# Patient Record
Sex: Male | Born: 1945 | Race: Black or African American | Hispanic: No | Marital: Married | Smoking: Never smoker
Health system: Southern US, Community
[De-identification: ages and names within clinical notes are randomized; demographics above are authoritative.]

## PROBLEM LIST (undated history)

## (undated) DIAGNOSIS — K219 Gastro-esophageal reflux disease without esophagitis: Secondary | ICD-10-CM

## (undated) DIAGNOSIS — E785 Hyperlipidemia, unspecified: Secondary | ICD-10-CM

## (undated) DIAGNOSIS — N400 Enlarged prostate without lower urinary tract symptoms: Secondary | ICD-10-CM

## (undated) DIAGNOSIS — E119 Type 2 diabetes mellitus without complications: Secondary | ICD-10-CM

## (undated) DIAGNOSIS — J301 Allergic rhinitis due to pollen: Secondary | ICD-10-CM

## (undated) DIAGNOSIS — N529 Male erectile dysfunction, unspecified: Secondary | ICD-10-CM

## (undated) HISTORY — DX: Benign prostatic hyperplasia without lower urinary tract symptoms: N40.0

## (undated) HISTORY — PX: KIDNEY STONE SURGERY: SHX686

## (undated) HISTORY — DX: Allergic rhinitis due to pollen: J30.1

## (undated) HISTORY — DX: Male erectile dysfunction, unspecified: N52.9

## (undated) HISTORY — DX: Type 2 diabetes mellitus without complications: E11.9

## (undated) HISTORY — DX: Hyperlipidemia, unspecified: E78.5

## (undated) HISTORY — DX: Gastro-esophageal reflux disease without esophagitis: K21.9

---

## 1969-07-17 HISTORY — PX: WRIST SURGERY: SHX841

## 2000-04-14 ENCOUNTER — Emergency Department (HOSPITAL_COMMUNITY): Admission: EM | Admit: 2000-04-14 | Discharge: 2000-04-14 | Payer: Self-pay | Admitting: Emergency Medicine

## 2000-04-14 ENCOUNTER — Encounter: Payer: Self-pay | Admitting: Emergency Medicine

## 2004-11-14 ENCOUNTER — Ambulatory Visit: Payer: Self-pay | Admitting: Internal Medicine

## 2005-02-16 ENCOUNTER — Ambulatory Visit: Payer: Self-pay | Admitting: Internal Medicine

## 2005-03-18 ENCOUNTER — Ambulatory Visit: Payer: Self-pay | Admitting: Internal Medicine

## 2005-04-10 ENCOUNTER — Ambulatory Visit: Payer: Self-pay | Admitting: Internal Medicine

## 2005-08-28 ENCOUNTER — Ambulatory Visit: Payer: Self-pay | Admitting: Internal Medicine

## 2007-02-03 ENCOUNTER — Ambulatory Visit: Payer: Self-pay | Admitting: Internal Medicine

## 2007-02-03 LAB — CONVERTED CEMR LAB
ALT: 29 units/L (ref 0–40)
AST: 21 units/L (ref 0–37)
Albumin: 3.7 g/dL (ref 3.5–5.2)
Alkaline Phosphatase: 65 units/L (ref 39–117)
BUN: 12 mg/dL (ref 6–23)
Basophils Absolute: 0 10*3/uL (ref 0.0–0.1)
Basophils Relative: 0.4 % (ref 0.0–1.0)
Bilirubin, Direct: 0.1 mg/dL (ref 0.0–0.3)
CO2: 30 meq/L (ref 19–32)
Calcium: 9.1 mg/dL (ref 8.4–10.5)
Chloride: 103 meq/L (ref 96–112)
Cholesterol: 229 mg/dL (ref 0–200)
Creatinine, Ser: 0.9 mg/dL (ref 0.4–1.5)
Direct LDL: 169.8 mg/dL
Eosinophils Absolute: 0.3 10*3/uL (ref 0.0–0.6)
Eosinophils Relative: 5.2 % — ABNORMAL HIGH (ref 0.0–5.0)
GFR calc Af Amer: 111 mL/min
GFR calc non Af Amer: 92 mL/min
Glucose, Bld: 112 mg/dL — ABNORMAL HIGH (ref 70–99)
HCT: 43.9 % (ref 39.0–52.0)
HDL: 41.4 mg/dL (ref >39.0)
Hemoglobin: 15 g/dL (ref 13.0–17.0)
Lymphocytes Relative: 42.5 % (ref 12.0–46.0)
MCHC: 34.1 g/dL (ref 30.0–36.0)
MCV: 86.6 fL (ref 78.0–100.0)
Monocytes Absolute: 0.7 10*3/uL (ref 0.2–0.7)
Monocytes Relative: 10.9 % (ref 3.0–11.0)
Neutro Abs: 2.5 10*3/uL (ref 1.4–7.7)
Neutrophils Relative %: 41 % — ABNORMAL LOW (ref 43.0–77.0)
PSA: 0.52 ng/mL (ref 0.10–4.00)
Platelets: 231 10*3/uL (ref 150–400)
Potassium: 3.9 meq/L (ref 3.5–5.1)
RBC: 5.07 M/uL (ref 4.22–5.81)
RDW: 13.7 % (ref 11.5–14.6)
Sodium: 139 meq/L (ref 135–145)
TSH: 0.9 microintl units/mL (ref 0.35–5.50)
Total Bilirubin: 0.6 mg/dL (ref 0.3–1.2)
Total CHOL/HDL Ratio: 5.5
Total Protein: 6.9 g/dL (ref 6.0–8.3)
Triglycerides: 116 mg/dL (ref 0–149)
VLDL: 23 mg/dL (ref 0–40)
WBC: 6 10*3/uL (ref 4.5–10.5)

## 2007-02-10 ENCOUNTER — Ambulatory Visit: Payer: Self-pay | Admitting: Internal Medicine

## 2008-03-08 ENCOUNTER — Telehealth (INDEPENDENT_AMBULATORY_CARE_PROVIDER_SITE_OTHER): Payer: Self-pay | Admitting: *Deleted

## 2009-02-12 ENCOUNTER — Emergency Department (HOSPITAL_COMMUNITY): Admission: EM | Admit: 2009-02-12 | Discharge: 2009-02-12 | Payer: Self-pay | Admitting: Family Medicine

## 2009-02-14 ENCOUNTER — Ambulatory Visit: Payer: Self-pay | Admitting: Internal Medicine

## 2009-02-14 DIAGNOSIS — H8110 Benign paroxysmal vertigo, unspecified ear: Secondary | ICD-10-CM

## 2009-08-28 ENCOUNTER — Ambulatory Visit: Payer: Self-pay | Admitting: Family Medicine

## 2009-08-28 ENCOUNTER — Encounter: Payer: Self-pay | Admitting: Family Medicine

## 2009-10-18 ENCOUNTER — Ambulatory Visit: Payer: Self-pay | Admitting: Internal Medicine

## 2009-10-18 DIAGNOSIS — R7301 Impaired fasting glucose: Secondary | ICD-10-CM

## 2009-10-22 LAB — CONVERTED CEMR LAB
Cholesterol: 243 mg/dL — ABNORMAL HIGH (ref 0–200)
HDL: 44 mg/dL (ref >39)
Hgb A1c MFr Bld: 7.2 % — ABNORMAL HIGH (ref 4.6–6.1)
LDL Cholesterol: 150 mg/dL — ABNORMAL HIGH (ref 0–99)
PSA: 0.91 ng/mL (ref 0.10–4.00)
Total CHOL/HDL Ratio: 5.5
Triglycerides: 245 mg/dL — ABNORMAL HIGH (ref <150)
VLDL: 49 mg/dL — ABNORMAL HIGH (ref 0–40)

## 2009-11-01 ENCOUNTER — Ambulatory Visit: Payer: Self-pay | Admitting: Internal Medicine

## 2009-11-04 ENCOUNTER — Encounter: Admission: RE | Admit: 2009-11-04 | Discharge: 2009-11-13 | Payer: Self-pay | Admitting: Internal Medicine

## 2009-11-04 ENCOUNTER — Encounter: Payer: Self-pay | Admitting: Internal Medicine

## 2009-11-04 LAB — CONVERTED CEMR LAB: Fecal Occult Bld: NEGATIVE

## 2010-06-17 ENCOUNTER — Ambulatory Visit: Payer: Self-pay | Admitting: Family Medicine

## 2010-06-17 DIAGNOSIS — J069 Acute upper respiratory infection, unspecified: Secondary | ICD-10-CM | POA: Insufficient documentation

## 2010-07-24 ENCOUNTER — Telehealth: Payer: Self-pay | Admitting: Family Medicine

## 2010-07-24 DIAGNOSIS — H9209 Otalgia, unspecified ear: Secondary | ICD-10-CM | POA: Insufficient documentation

## 2010-07-25 ENCOUNTER — Encounter: Payer: Self-pay | Admitting: Internal Medicine

## 2010-10-27 ENCOUNTER — Telehealth: Payer: Self-pay | Admitting: Internal Medicine

## 2010-12-16 NOTE — Consult Note (Signed)
Summary: [facility] Ear Nose & Throat  [facility] Ear Nose & Throat   Imported By: Sherian Rein 08/02/2010 09:44:04  _____________________________________________________________________  External Attachment:    Type:   Image     Comment:   External Document  Appended Document: [facility] Ear Nose & Throat left sided TMJ Trying ibuprofen

## 2010-12-16 NOTE — Progress Notes (Signed)
Summary: requesting refill for augmentin  Phone Note Call from Patient Call back at Home Phone [phone] Call back at [phone]   Caller: Patient Call For: Dr. Dayton Martes Summary of Call: Patient was in on 06-17-10 w/ ear pain. He is now on his last day of augmentin and the pain in his ear is coming back just like before. He says that there is popping and is becoming swollen again like before. He is asking if he could get a refil on the augmentin and if you feel like he may need a referral to ENT to see why this want go away. Please advise. Patient is at work and he says to leave message w/ his fiance at his home number. Uses [employer] on [address].  Initial call taken by: Melody Comas,  July 24, 2010 1:33 PM  Follow-up for Phone Call        Agree with ENT referral.  We do not give abx without being seen.  I will place ENT referral today and hopefully we can get in him in right away. Ruthe Mannan MD  July 24, 2010 1:40 PM   Spoke w/ patient and he will wait for call w/ appt for ENT. Home number disconnected. He can be reached at [phone]. Follow-up by: Melody Comas,  July 24, 2010 1:51 PM  New Problems: EAR PAIN (ICD-388.70)   New Problems: EAR PAIN (ICD-388.70)

## 2010-12-16 NOTE — Assessment & Plan Note (Signed)
Summary: ear ache, inner ear/alc   Vital Signs:  Patient profile:   65 year old Douglas [name] Height:      67.5 inches Weight:      190.13 pounds BMI:     29.45 Temp:     97.7 degrees F oral Pulse rate:   76 / minute Pulse rhythm:   regular BP sitting:   122 / 80  (left arm) Cuff size:   regular  Vitals Entered By: Linde Gillis CMA Duncan Dull) (June 17, 2010 8:08 AM) CC: left ear ache   History of Present Illness: 65 yo here for 1 week of worsening left ear pain. Has runny nose, facial pressure, feels feverish, headache. No cough, shortness of breath or wheezing. Taking OTC decongestants but left ear pain and popping is worsening.  Current Medications (verified): 1)  Viagra 100 Mg Tabs (Sildenafil Citrate) .... 1/2-1 Tab About 30-60 Minutes Before Sex 2)  Augmentin 875-125 Mg Tabs (Amoxicillin-Pot Clavulanate) .Marland Kitchen.. 1 By Mouth 2 Times Daily 3)  Vicodin 5-500 Mg Tabs (Hydrocodone-Acetaminophen) .Marland Kitchen.. 1 Tab Every 6 Hours As Needed For Pain  Allergies (verified): No Known Drug Allergies  Review of Systems      See HPI General:  Denies fever. ENT:  Complains of earache, nasal congestion, sinus pressure, and sore throat; denies ear discharge. Resp:  Denies cough, shortness of breath, sputum productive, and wheezing.  Physical Exam  General:  alert and normal appearance.   Ears:  Left TM and canal erythema, tragus non tender Right TM normal Nose:  nasal dischargemucosal pallor.   Mouth:  pharyngeal erythema Lungs:  normal respiratory effort and normal breath sounds.   Heart:  normal rate, regular rhythm, no murmur, and no gallop.   Extremities:  no edema Psych:  normally interactive, good eye contact, not anxious appearing, and not depressed appearing.     Impression & Recommendations:  Problem # 1:  URI (ICD-465.9) Assessment New Given progression and duration of symptoms, will treat for bacterial sinusitis/otitis with Augmentin. See pt instructions for details.  Complete  Medication List: 1)  Viagra 100 Mg Tabs (Sildenafil citrate) .... 1/2-1 tab about 30-60 minutes before sex 2)  Augmentin 875-125 Mg Tabs (Amoxicillin-pot clavulanate) .Marland Kitchen.. 1 by mouth 2 times daily 3)  Vicodin 5-500 Mg Tabs (Hydrocodone-acetaminophen) .Marland Kitchen.. 1 tab every 6 hours as needed for pain  Patient Instructions: 1)  Take antibiotic as directed.  Drink lots of fluids.  Treat sympotmatically with Mucinex, nasal saline irrigation, and Tylenol/Ibuprofen. Also try claritin D or zyrtec D over the counter- two times a day as needed ( have to sign for them at pharmacy). You can use warm compresses.  Cough suppressant at night. Call if not improving as expected in 5-7 days.  Prescriptions: VICODIN 5-500 MG TABS (HYDROCODONE-ACETAMINOPHEN) 1 tab every 6 hours as needed for pain  #30 x 0   Entered and Authorized by:   Ruthe Mannan MD   Signed by:   Ruthe Mannan MD on 06/17/2010   Method used:   Print then Give to Patient   RxID:   [id number] AUGMENTIN 875-125 MG TABS (AMOXICILLIN-POT CLAVULANATE) 1 by mouth 2 times daily  #Douglas x 3   Entered and Authorized by:   Ruthe Mannan MD   Signed by:   Ruthe Mannan MD on 06/17/2010   Method used:   Print then Give to Patient   RxID:   [id number]   Current Allergies (reviewed today): No known allergies

## 2010-12-18 NOTE — Progress Notes (Signed)
Summary: VIAGRA  Phone Note Refill Request [facility]  [facility] [phone] on October 27, 2010 11:58 AM  Refills Requested: Medication #1:  VIAGRA 100 MG TABS 1/2-1 tab about 30-60 minutes before sex   Last Refilled: 04/17/2008 E-Scribe Request, ok to fill? last refilled 04/17/08, pt last seen 10/2009   Method Requested: Electronic Initial call taken by: Mervin Hack CMA Duncan Dull),  October 27, 2010 11:58 AM  Follow-up for Phone Call        okay #6 x 2  have him set up physical in the next few months Follow-up by: Cindee Salt MD,  October 27, 2010 1:20 PM  Additional Follow-up for Phone Call Additional follow up Details #1::        Rx faxed to pharmacy, home number is disconnected and pt doesn't work at number provided for his job, will leave message at the pharmacy. They will let pt know he will need a office visit soon. DeShannon Smith CMA Duncan Dull)  October 27, 2010 2:30 PM   okay Additional Follow-up by: Cindee Salt MD,  October 27, 2010 2:35 PM    Prescriptions: VIAGRA 100 MG TABS (SILDENAFIL CITRATE) 1/2-1 tab about 30-60 minutes before sex  #6 x 2   Entered by:   Mervin Hack CMA (AAMA)   Authorized by:   Cindee Salt MD   Signed by:   Mervin Hack CMA (AAMA) on 10/27/2010   Method used:   Electronically to        [id number] (retail)       [address]       [place], [place]  [id number]       Ph: [phone]       Fax: [phone]   RxID:   [id number]

## 2010-12-21 ENCOUNTER — Emergency Department (HOSPITAL_BASED_OUTPATIENT_CLINIC_OR_DEPARTMENT_OTHER): Admission: EM | Admit: 2010-12-21 | Payer: Self-pay | Source: Home / Self Care

## 2011-02-26 LAB — POCT I-STAT, CHEM 8
BUN: 11 mg/dL (ref 6–23)
Calcium, Ion: 1.23 mmol/L (ref 1.12–1.32)
Chloride: 102 mEq/L (ref 96–112)
Creatinine, Ser: 1 mg/dL (ref 0.4–1.5)
Glucose, Bld: 98 mg/dL (ref 70–99)
HCT: 51 % (ref 39.0–52.0)
Hemoglobin: 17.3 g/dL — ABNORMAL HIGH (ref 13.0–17.0)
Potassium: 4.2 mEq/L (ref 3.5–5.1)
Sodium: 139 mEq/L (ref 135–145)
TCO2: 28 mmol/L (ref 0–100)

## 2012-10-12 ENCOUNTER — Telehealth: Payer: Self-pay

## 2012-10-12 NOTE — Telephone Encounter (Signed)
4-5 days ago pt noticed sensitive area on end of penis, red, pt not sure if blister or not; no discharge and no pain upon urination. Pt is sexually active but will abstain until checked. Appt scheduled 10/14/12 at 8 am. If pts condition changes or worsens will go to urgent care.

## 2012-10-14 ENCOUNTER — Encounter: Payer: Self-pay | Admitting: Family Medicine

## 2012-10-14 ENCOUNTER — Ambulatory Visit (INDEPENDENT_AMBULATORY_CARE_PROVIDER_SITE_OTHER): Payer: BC Managed Care – PPO | Admitting: Family Medicine

## 2012-10-14 VITALS — BP 142/94 | HR 76 | Temp 97.7°F | Wt 188.5 lb

## 2012-10-14 DIAGNOSIS — N489 Disorder of penis, unspecified: Secondary | ICD-10-CM

## 2012-10-14 DIAGNOSIS — Z209 Contact with and (suspected) exposure to unspecified communicable disease: Secondary | ICD-10-CM

## 2012-10-14 DIAGNOSIS — N4889 Other specified disorders of penis: Secondary | ICD-10-CM

## 2012-10-14 NOTE — Progress Notes (Signed)
   Nature conservation officer at [facility] [address] [place] [address] Phone: [phone] Fax: [phone]  Date:  10/14/2012   Name:  Douglas [name]   DOB:  Mar 04, 1946   MRN:  [id number] Gender: male Age: 66 y.o.  PCP:  Tillman Abide, MD  Evaluating MD: Hannah Beat, MD   Chief Complaint: Personal Problem   History of Present Illness:  Douglas [name] is a 66 y.o. pleasant patient who presents with the following:  Slept with someone on 111/16/2013 without a condrom. No history of STD. 18th or 19th, but does have a little red bister and the head is a little reddish, does not have any pain. Every now and then, a little burning at the tip. Occ. Burning, no discharge. 2 areas of ulcer, no real pain.  Patient Active Problem List  Diagnosis  . DIABETES MELLITUS, TYPE II  . HYPERLIPIDEMIA  . ALLERGIC RHINITIS  . GERD  . VERTIGO    No past medical history on file.  No past surgical history on file.  History  Substance Use Topics  . Smoking status: Never Smoker   . Smokeless tobacco: Not on file  . Alcohol Use: No    No family history on file.  No Known Allergies  Medication list has been reviewed and updated.  No outpatient prescriptions prior to visit.    Last reviewed on 10/14/2012  8:23 AM by Shon Millet, CMA  Review of Systems:  ROS: GEN: Acute illness details above GI: Tolerating PO intake GU: maintaining adequate hydration and urination Pulm: No SOB Interactive and getting along well at home.  Otherwise, ROS is as per the HPI.   Physical Examination: Filed Vitals:   10/14/12 0818  BP: 142/94  Pulse: 76  Temp: 97.7 F (36.5 C)  Weight: 188 lb 8 oz (85.503 kg)    There is no height on file to calculate BMI. Ideal Body Weight:     GEN: WDWN, NAD, Non-toxic, Alert & Oriented x 3 HEENT: Atraumatic, Normocephalic.  Ears and Nose: No external deformity. EXTR: No clubbing/cyanosis/edema NEURO: Normal gait.  PSYCH: Normally  interactive. Conversant. Not depressed or anxious appearing.  Calm demeanor.  GU: uncirc male, intact, des testes, small red ulcer on glans and 2 prominent ulcers near meatus.  Assessment and Plan:  1. Penile lesion  Herpes simplex virus culture, GC/chlamydia probe amp, genital, HIV antibody, RPR  2. Exposure to communicable disease     >25 minutes spent in face to face time with patient, >50% spent in counselling or coordination of care: talked about different stds, herpes, lifelong infection, condom use. Answered questions. Clinically most looks like herpes, but not painful Check std labs  If all neg, treat for bact urethritis  Orders Today:  Orders Placed This Encounter  Procedures  . Herpes simplex virus culture  . GC/chlamydia probe amp, genital  . HIV antibody  . RPR    Updated Medication List: (Includes new medications, updates to list, dose adjustments) No orders of the defined types were placed in this encounter.    Medications Discontinued: There are no discontinued medications.   Hannah Beat, MD

## 2012-10-14 NOTE — Addendum Note (Signed)
Addended by: Alvina Chou on: 10/14/2012 09:42 AM   Modules accepted: Orders

## 2012-10-15 LAB — GC/CHLAMYDIA PROBE AMP, GENITAL

## 2012-10-15 LAB — RPR

## 2012-10-16 LAB — GC/CHLAMYDIA PROBE AMP
CT Probe RNA: NEGATIVE
GC Probe RNA: NEGATIVE

## 2012-10-18 LAB — HERPES SIMPLEX VIRUS CULTURE: Organism ID, Bacteria: NOT DETECTED

## 2012-10-25 ENCOUNTER — Encounter: Payer: Self-pay | Admitting: *Deleted

## 2012-11-11 ENCOUNTER — Ambulatory Visit: Payer: Self-pay | Admitting: Family Medicine

## 2013-05-16 ENCOUNTER — Encounter: Payer: Self-pay | Admitting: Family Medicine

## 2013-05-16 ENCOUNTER — Ambulatory Visit (INDEPENDENT_AMBULATORY_CARE_PROVIDER_SITE_OTHER): Payer: BC Managed Care – PPO | Admitting: Family Medicine

## 2013-05-16 VITALS — BP 140/92 | HR 89 | Temp 97.9°F | Ht 67.5 in | Wt 184.0 lb

## 2013-05-16 DIAGNOSIS — N489 Disorder of penis, unspecified: Secondary | ICD-10-CM | POA: Insufficient documentation

## 2013-05-16 DIAGNOSIS — N4889 Other specified disorders of penis: Secondary | ICD-10-CM

## 2013-05-16 MED ORDER — AZITHROMYCIN 500 MG PO TABS: 1000.0000 mg | ORAL_TABLET | Freq: Once | ORAL | Status: DC

## 2013-05-16 NOTE — Patient Instructions (Addendum)
We will call you with herpes culture results.  We will go ahead and treat you for bacterial urethritis as Dr. Patsy Lager intended in 09/2012.  Could possibly be due to irritation from soap.Marland Kitchen Use hypoallergenic detergent and body soap only.  If not improving call for referral to urologist.

## 2013-05-16 NOTE — Progress Notes (Signed)
  Subjective:    Patient ID: Douglas [name], male    DOB: February 20, 1946, 67 y.o.   MRN: [id number]  HPI 67 year old male presetns for evaluation of penile rash. He was seen in November 2013 for similar rash by Dr. Patsy Lager.  he noted onset of 3 ulcers, one at tip of penis following sexual activity at that time. He was evaluated for GC/chlamydia, herpes culture, RPR, trichomonas, HIV... Returned negative. He was treated with azithromycin x 1 g for possible bacterial urethritis at that time.  He never took any treatment.  He nted lesions go away almost entirely but still ahd some sensitivity to touvch  1 week ago he noted the lesions has gradually worsening somewhat, may be spreading toward base of glans. Are is tender. No pain with urination. No urethral discharge. He is applying vaseline and occ neosporin.  No fever, feels well otherwise.  Has never had any similar issue, no past STDs.     Review of Systems  Constitutional: Negative for fever and fatigue.  HENT: Negative for ear pain.   Eyes: Negative for pain.  Respiratory: Negative for shortness of breath and wheezing.   Cardiovascular: Negative for chest pain.  Gastrointestinal: Negative for abdominal pain.       Objective:   Physical Exam  Constitutional: He appears well-developed and well-nourished.  Eyes: Conjunctivae are normal. Pupils are equal, round, and reactive to light.  Cardiovascular: Normal rate and regular rhythm.   No murmur heard. Pulmonary/Chest: Effort normal and breath sounds normal.  Abdominal: Soft. Bowel sounds are normal. Hernia confirmed negative in the right inguinal area and confirmed negative in the left inguinal area.  Genitourinary: Testes normal. Right testis shows no mass and no tenderness. Left testis shows no mass and no tenderness. Uncircumcised. Penile erythema present. No hypospadias.  Erythema  At 10 oclkock on glans of penis, erythematous nodules at 6 oclock on glans, new flaky skin on  left side of glans in spot pattern.  only tender areas are at 11 and 6 oclock. No trur ulceration.          Assessment & Plan:

## 2013-05-16 NOTE — Assessment & Plan Note (Signed)
STD eval neg from when issue began. Will reswab for herpes culture given tender lesions Will treat for bacterial urethritis.  If not improcving he will cahnge to hypoallergenic soaps for possibility of irritant dermatitis.. Can try OTC 2.5 % hydrocortisone cream BID x 2 weeks as well. If not improving consider referral to specialist.

## 2013-05-16 NOTE — Addendum Note (Signed)
Addended by: Consuello Masse on: 05/16/2013 12:38 PM   Modules accepted: Orders

## 2013-05-18 ENCOUNTER — Telehealth: Payer: Self-pay | Admitting: Family Medicine

## 2013-05-18 LAB — HERPES SIMPLEX VIRUS CULTURE: Organism ID, Bacteria: DETECTED

## 2013-05-18 MED ORDER — VALACYCLOVIR HCL 1 G PO TABS: 1000.0000 mg | ORAL_TABLET | Freq: Two times a day (BID) | ORAL | Status: DC

## 2013-05-18 NOTE — Telephone Encounter (Signed)
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  We need to treat him with valacyclovir.   This is contagious with sexual contact.. Let partners know about exposure. NO sexual contact while lesions remain.  he can have flares off and on lifelong.. No cure.  If frequent flares he can take daily med to decrease flare frequency.

## 2013-05-18 NOTE — Telephone Encounter (Signed)
Patient advised and will get medication

## 2013-07-27 ENCOUNTER — Ambulatory Visit (INDEPENDENT_AMBULATORY_CARE_PROVIDER_SITE_OTHER): Payer: BC Managed Care – PPO | Admitting: Internal Medicine

## 2013-07-27 ENCOUNTER — Encounter: Payer: Self-pay | Admitting: Internal Medicine

## 2013-07-27 VITALS — BP 150/80 | HR 86 | Temp 98.2°F | Wt 186.0 lb

## 2013-07-27 DIAGNOSIS — N4889 Other specified disorders of penis: Secondary | ICD-10-CM

## 2013-07-27 DIAGNOSIS — N489 Disorder of penis, unspecified: Secondary | ICD-10-CM

## 2013-07-27 DIAGNOSIS — R7301 Impaired fasting glucose: Secondary | ICD-10-CM

## 2013-07-27 DIAGNOSIS — Z125 Encounter for screening for malignant neoplasm of prostate: Secondary | ICD-10-CM

## 2013-07-27 DIAGNOSIS — N529 Male erectile dysfunction, unspecified: Secondary | ICD-10-CM | POA: Insufficient documentation

## 2013-07-27 DIAGNOSIS — J301 Allergic rhinitis due to pollen: Secondary | ICD-10-CM | POA: Insufficient documentation

## 2013-07-27 DIAGNOSIS — K219 Gastro-esophageal reflux disease without esophagitis: Secondary | ICD-10-CM | POA: Insufficient documentation

## 2013-07-27 DIAGNOSIS — E785 Hyperlipidemia, unspecified: Secondary | ICD-10-CM

## 2013-07-27 MED ORDER — TRIAMCINOLONE ACETONIDE 0.1 % EX CREA: TOPICAL_CREAM | Freq: Two times a day (BID) | CUTANEOUS | Status: DC | PRN

## 2013-07-27 NOTE — Assessment & Plan Note (Signed)
Doesn't look herpetic now--- or fungal ?lichen planus  Will try steroid If not effective, to derm

## 2013-07-27 NOTE — Patient Instructions (Signed)
Please try a small amount of the prescription cream on the sore spots on your penis----if they don't improve, call for referral to a dermatologist.

## 2013-07-27 NOTE — Progress Notes (Signed)
  Subjective:    Patient ID: Douglas [name], male    DOB: [birth year], 67 y.o.   MRN: [id number]  HPI Hasn't been seen in some time  Has lesion on penis again Took the 10 days of valtrex after tested positive for herpes Cleared partially-- then worsened again  Same girlfriend for 8-9 years No sex for 2 years of so Has been abstinent for some time  No current outpatient prescriptions on file prior to visit.   No current facility-administered medications on file prior to visit.    No Known Allergies  Past Medical History  Diagnosis Date  . GERD (gastroesophageal reflux disease)   . Allergic rhinitis due to pollen   . Impaired fasting glucose   . Hyperlipidemia   . ED (erectile dysfunction)     Past Surgical History  Procedure Laterality Date  . Wrist surgery  1970's  . Kidney stone surgery  ~1990    Family History  Problem Relation Age of Onset  . Cancer Mother     History   Social History  . Marital Status: Divorced    Spouse Name: N/A    Number of Children: 3  . Years of Education: N/A   Occupational History  . [employer]   Social History Main Topics  . Smoking status: Never Smoker   . Smokeless tobacco: Never Used  . Alcohol Use: No  . Drug Use: No  . Sexual Activity: Not on file   Other Topics Concern  . Not on file   Social History Narrative  . No narrative on file   Review of Systems Occasionally gets slight vertigo---if he bends and stands up quick Weight fairly stable    Objective:   Physical Exam  Constitutional: He appears well-developed and well-nourished. No distress.  Genitourinary:  Shallow ulcerations on glans No discharge Urethra normal          Assessment & Plan:

## 2013-07-28 LAB — BASIC METABOLIC PANEL
BUN: 13 mg/dL (ref 6–23)
Chloride: 102 mEq/L (ref 96–112)
Creatinine, Ser: 1.1 mg/dL (ref 0.4–1.5)
GFR: 83.54 mL/min (ref >60.00)

## 2013-07-28 LAB — CBC WITH DIFFERENTIAL/PLATELET
Basophils Relative: 0.6 % (ref 0.0–3.0)
Eosinophils Relative: 4.8 % (ref 0.0–5.0)
Lymphocytes Relative: 37.4 % (ref 12.0–46.0)
Monocytes Relative: 9.6 % (ref 3.0–12.0)
Neutrophils Relative %: 47.6 % (ref 43.0–77.0)
Platelets: 196 10*3/uL (ref 150.0–400.0)
RBC: 5.08 Mil/uL (ref 4.22–5.81)
WBC: 6.1 10*3/uL (ref 4.5–10.5)

## 2013-07-28 LAB — PSA: PSA: 1.29 ng/mL (ref 0.10–4.00)

## 2013-07-28 LAB — HEPATIC FUNCTION PANEL
ALT: 42 U/L (ref 0–53)
AST: 23 U/L (ref 0–37)
Alkaline Phosphatase: 78 U/L (ref 39–117)
Bilirubin, Direct: 0.1 mg/dL (ref 0.0–0.3)
Total Bilirubin: 0.6 mg/dL (ref 0.3–1.2)

## 2013-07-28 LAB — TSH: TSH: 0.57 u[IU]/mL (ref 0.35–5.50)

## 2013-07-28 LAB — LIPID PANEL
Cholesterol: 260 mg/dL — ABNORMAL HIGH (ref 0–200)
Total CHOL/HDL Ratio: 7
Triglycerides: 380 mg/dL — ABNORMAL HIGH (ref 0.0–149.0)

## 2013-07-28 LAB — LDL CHOLESTEROL, DIRECT: Direct LDL: 188.3 mg/dL

## 2013-07-29 ENCOUNTER — Encounter: Payer: Self-pay | Admitting: Internal Medicine

## 2013-08-08 ENCOUNTER — Other Ambulatory Visit: Payer: Self-pay | Admitting: Internal Medicine

## 2013-08-08 ENCOUNTER — Other Ambulatory Visit: Payer: Self-pay | Admitting: *Deleted

## 2013-08-08 ENCOUNTER — Encounter: Payer: Self-pay | Admitting: *Deleted

## 2013-08-08 DIAGNOSIS — E119 Type 2 diabetes mellitus without complications: Secondary | ICD-10-CM

## 2013-08-08 MED ORDER — METFORMIN HCL 1000 MG PO TABS: 1000.0000 mg | ORAL_TABLET | Freq: Two times a day (BID) | ORAL | Status: DC

## 2013-08-10 ENCOUNTER — Telehealth: Payer: Self-pay | Admitting: Internal Medicine

## 2013-08-10 MED ORDER — FLUCONAZOLE 100 MG PO TABS: 100.0000 mg | ORAL_TABLET | Freq: Every day | ORAL | Status: DC

## 2013-08-10 MED ORDER — KETOCONAZOLE 2 % EX CREA: TOPICAL_CREAM | Freq: Every day | CUTANEOUS | Status: DC

## 2013-08-10 NOTE — Telephone Encounter (Signed)
While speaking to the pt regarding his NDM referral, he informed me that the Kenalog cream that was prescribed on 07/27/13 was not working well and he wanted to let Dr. Alphonsus Sias know.  Best number to call patient:  [phone] (may leave message and he will return call) Pharmacy of choice:  [facility]

## 2013-08-10 NOTE — Telephone Encounter (Signed)
Given his diabetes diagnosis, this lesions may be fungal Will change therapy Discussed this with him

## 2013-09-07 ENCOUNTER — Encounter: Payer: BC Managed Care – PPO | Attending: Internal Medicine

## 2013-09-07 DIAGNOSIS — Z713 Dietary counseling and surveillance: Secondary | ICD-10-CM | POA: Insufficient documentation

## 2013-09-07 DIAGNOSIS — E119 Type 2 diabetes mellitus without complications: Secondary | ICD-10-CM | POA: Insufficient documentation

## 2013-09-14 DIAGNOSIS — E119 Type 2 diabetes mellitus without complications: Secondary | ICD-10-CM

## 2013-09-14 NOTE — Progress Notes (Signed)
Patient was seen on 09/14/13 for the second of a series of three diabetes self-management courses at [facility]. The following learning objectives were met by the patient during this class:   Describe the role of different macronutrients on glucose  Explain how carbohydrates affect blood glucose  State what foods contain the most carbohydrates  Demonstrate carbohydrate counting  Demonstrate how to read Nutrition Facts food label  Describe effects of various fats on heart health  Describe the importance of good nutrition for health and healthy eating strategies  Describe techniques for managing your shopping, cooking and meal planning  List strategies to follow meal plan when dining out  Describe the effects of alcohol on glucose and how to use it safely  Follow-Up Plan:  Attend Core 3  Work towards following your personal food plan.   

## 2013-09-21 ENCOUNTER — Encounter: Payer: BC Managed Care – PPO | Attending: Internal Medicine

## 2013-09-21 DIAGNOSIS — E119 Type 2 diabetes mellitus without complications: Secondary | ICD-10-CM

## 2013-09-21 DIAGNOSIS — Z713 Dietary counseling and surveillance: Secondary | ICD-10-CM | POA: Insufficient documentation

## 2013-09-28 NOTE — Progress Notes (Signed)
Patient was seen on 09/21/13 for the third of a series of three diabetes self-management courses at the [facility]. The following learning objectives were met by the patient during this class:    State the amount of activity recommended for healthy living   Describe activities suitable for individual needs   Identify ways to regularly incorporate activity into daily life   Identify barriers to activity and ways to over come these barriers  Identify diabetes medications being personally used and their primary action for lowering glucose and possible side effects   Describe role of stress on blood glucose and develop strategies to address psychosocial issues   Identify diabetes complications and ways to prevent them  Explain how to manage diabetes during illness   Evaluate success in meeting personal goal   Establish 2-3 goals that they will plan to diligently work on until they return for the free 77-month follow-up visit  Your patient has established the following 4 month goals in their individualized success plan:  Count Carbohydrates at most meal and snacks  Be active 15 minutes or more 5 times a week  Test glucose 1X daily 4Xweek Look at patterns in my record book at least 4 days a month  Your patient has identified these potential barriers to change:  None noted  Your patient has identified their diabetes self-care support plan as  [facility]

## 2014-01-15 LAB — HM DIABETES EYE EXAM

## 2014-03-22 ENCOUNTER — Telehealth: Payer: Self-pay

## 2014-03-22 MED ORDER — SILDENAFIL CITRATE 100 MG PO TABS: ORAL_TABLET | ORAL | Status: DC

## 2014-03-22 NOTE — Telephone Encounter (Signed)
Okay sildenafil 100mg  #5 x 2 Use daily prn about 30-60 minutes before sex  Have him schedule PE in the next few months

## 2014-03-22 NOTE — Telephone Encounter (Signed)
[facility] [address] Pt request cb.

## 2014-03-22 NOTE — Telephone Encounter (Signed)
rx sent to pharmacy by e-script Left message on VM that rx was sent to pharmacy

## 2014-04-19 ENCOUNTER — Encounter: Payer: Self-pay | Admitting: Family Medicine

## 2014-04-19 ENCOUNTER — Ambulatory Visit (INDEPENDENT_AMBULATORY_CARE_PROVIDER_SITE_OTHER): Payer: BC Managed Care – PPO | Admitting: Family Medicine

## 2014-04-19 VITALS — BP 120/76 | HR 74 | Temp 97.8°F | Wt 181.5 lb

## 2014-04-19 DIAGNOSIS — R42 Dizziness and giddiness: Secondary | ICD-10-CM

## 2014-04-19 MED ORDER — MECLIZINE HCL 25 MG PO TABS: 25.0000 mg | ORAL_TABLET | Freq: Three times a day (TID) | ORAL | Status: DC | PRN

## 2014-04-19 NOTE — Progress Notes (Signed)
BP 120/76  Pulse 74  Temp(Src) 97.8 F (36.6 C) (Oral)  Wt 181 lb 8 oz (82.328 kg)  SpO2 96%   CC: ?ear infection  Subjective:    Patient ID: Douglas [name], male    DOB: 1946-06-11, 68 y.o.   MRN: [id number]  HPI: Douglas BeatJohn W Peeples is a 68 y.o. male presenting on 04/19/2014 for ? ear infection, balance off, ST   Pleasant 68 yo pt of Dr. Karle StarchLetvak's with h/o DM presents with 1 wk h/o muffled R hearing with echo, suddenly increased dizziness last night while working underneath his truck described as vertigo that lasted seconds. Felt nauseated as well and vomited x1, felt better after this. Dizzy with head position changes. Mild ST. + sinus congestion. PNdrainage. White mucous out of nose, slightly bloody.   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  No fevers/chills, ear or tooth pain, abd pain, cough Takes sinus pill daily piror to work.  No sick contacts at home. No smokers at home. No h/o asthma. H/o allergic rhinitis  Relevant past medical, surgical, family and social history reviewed and updated as indicated.  Allergies and medications reviewed and updated. Current Outpatient Prescriptions on File Prior to Visit  Medication Sig  . ketoconazole (NIZORAL) 2 % cream Apply topically daily.  . metFORMIN (GLUCOPHAGE) 1000 MG tablet Take 1 tablet (1,000 mg total) by mouth 2 (two) times daily with a meal.  . sildenafil (VIAGRA) 100 MG tablet Take 1 tablet by mouth as needed 30-60 minutes before sex  . triamcinolone cream (KENALOG) 0.1 % Apply topically 2 (two) times daily as needed.   No current facility-administered medications on file prior to visit.    Review of Systems Per HPI unless specifically indicated above    Objective:    BP 120/76  Pulse 74  Temp(Src) 97.8 F (36.6 C) (Oral)  Wt 181 lb 8 oz (82.328 kg)  SpO2 96%  Physical Exam  Nursing note and vitals reviewed. Constitutional: He is oriented to person, place, and time. He appears well-developed and  well-nourished. No distress.  HENT:  Head: Normocephalic and atraumatic.  Right Ear: Hearing, external ear and ear canal normal.  Left Ear: Hearing, external ear and ear canal normal.  Nose: Mucosal edema present. No rhinorrhea. Right sinus exhibits no maxillary sinus tenderness and no frontal sinus tenderness. Left sinus exhibits no maxillary sinus tenderness and no frontal sinus tenderness.  Mouth/Throat: Uvula is midline, oropharynx is clear and moist and mucous membranes are normal. No oropharyngeal exudate, posterior oropharyngeal edema, posterior oropharyngeal erythema or tonsillar abscesses.  Congested behind TMs R>L Congested nasal mucosa with white nasal mucous  Eyes: Conjunctivae and EOM are normal. Pupils are equal, round, and reactive to light. No scleral icterus.  Neck: Normal range of motion. Neck supple.  Cardiovascular: Normal rate, regular rhythm, normal heart sounds and intact distal pulses.   No murmur heard. Pulmonary/Chest: Effort normal and breath sounds normal. No respiratory distress. He has no wheezes. He has no rales.  Lymphadenopathy:    He has no cervical adenopathy.  Neurological: He is alert and oriented to person, place, and time.  + dix hallpike on R epley maneuver performed in office with good resolution of vertigo feelings  Skin: Skin is warm and dry. No rash noted.       Assessment & Plan:   Problem List Items Addressed This Visit   BPPV (benign paroxysmal positional vertigo) - Primary     R sided With possible mild  serous otitis. Pt declines flonase. Treated in office with epley maneuver. Supportive care as per instructions.  Meclizine prn.        Follow up plan: Return if symptoms worsen or fail to improve.

## 2014-04-19 NOTE — Progress Notes (Signed)
Pre visit review using our clinic review tool, if applicable. No additional management support is needed unless otherwise documented below in the visit note. 

## 2014-04-19 NOTE — Patient Instructions (Signed)
I think you had positional vertigo - treated with maneuvers in office.  May continue same maneuvers at home (provided today) May use meclizine as needed for vertigo/nausea as well. I don't think there's infection - but watch for fever >101 or worsening cough or congestion.  Benign Positional Vertigo Vertigo means you feel like you or your surroundings are moving when they are not. Benign positional vertigo is the most common form of vertigo. Benign means that the cause of your condition is not serious. Benign positional vertigo is more common in older adults. CAUSES  Benign positional vertigo is the result of an upset in the labyrinth system. This is an area in the middle ear that helps control your balance. This may be caused by a viral infection, head injury, or repetitive motion. However, often no specific cause is found. SYMPTOMS  Symptoms of benign positional vertigo occur when you move your head or eyes in different directions. Some of the symptoms may include:  Loss of balance and falls.  Vomiting.  Blurred vision.  Dizziness.  Nausea.  Involuntary eye movements (nystagmus). DIAGNOSIS  Benign positional vertigo is usually diagnosed by physical exam. If the specific cause of your benign positional vertigo is unknown, your caregiver may perform imaging tests, such as magnetic resonance imaging (MRI) or computed tomography (CT). TREATMENT  Your caregiver may recommend movements or procedures to correct the benign positional vertigo. Medicines such as meclizine, benzodiazepines, and medicines for nausea may be used to treat your symptoms. In rare cases, if your symptoms are caused by certain conditions that affect the inner ear, you may need surgery. HOME CARE INSTRUCTIONS   Follow your caregiver's instructions.  Move slowly. Do not make sudden body or head movements.  Avoid driving.  Avoid operating heavy machinery.  Avoid performing any tasks that would be dangerous to you or  others during a vertigo episode.  Drink enough fluids to keep your urine clear or pale yellow. SEEK IMMEDIATE MEDICAL CARE IF:   You develop problems with walking, weakness, numbness, or using your arms, hands, or legs.  You have difficulty speaking.  You develop severe headaches.  Your nausea or vomiting continues or gets worse.  You develop visual changes.  Your family or friends notice any behavioral changes.  Your condition gets worse.  You have a fever.  You develop a stiff neck or sensitivity to light. MAKE SURE YOU:   Understand these instructions.  Will watch your condition.  Will get help right away if you are not doing well or get worse. Document Released: 08/10/2006 Document Revised: 01/25/2012 Document Reviewed: 07/23/2011 [facility] Patient Information 2014 [place], [place].

## 2014-04-19 NOTE — Assessment & Plan Note (Signed)
R sided With possible mild serous otitis. Pt declines flonase. Treated in office with epley maneuver. Supportive care as per instructions.  Meclizine prn.

## 2014-05-23 ENCOUNTER — Ambulatory Visit (INDEPENDENT_AMBULATORY_CARE_PROVIDER_SITE_OTHER): Payer: BC Managed Care – PPO | Admitting: Family Medicine

## 2014-05-23 ENCOUNTER — Encounter: Payer: Self-pay | Admitting: Family Medicine

## 2014-05-23 VITALS — BP 118/76 | HR 86 | Temp 98.2°F | Wt 183.5 lb

## 2014-05-23 DIAGNOSIS — H8110 Benign paroxysmal vertigo, unspecified ear: Secondary | ICD-10-CM

## 2014-05-23 NOTE — Patient Instructions (Signed)
Take OTC meclizine 2-3 times a day in the meantime. Douglas [name] will call about your referral. Take care.

## 2014-05-23 NOTE — Progress Notes (Signed)
Pre visit review using our clinic review tool, if applicable. No additional management support is needed unless otherwise documented below in the visit note.  Prev note reviewed.  Meclizine helped some.  Still with some positional room spinning. "I had to hold on."  Still with echo sensation in the R ear.  No fevers.  No vomiting.  No presyncope.  Can happen with looking to the side, looking up, with rolling over in the bed.  Had tried home exercises w/o sig relief.  No other neuro sx.  No weakness.    Meds, vitals, and allergies reviewed.   ROS: See HPI.  Otherwise, noncontributory.  GEN: nad, alert and oriented HEENT: mucous membranes moist NECK: supple w/o LA CV: rrr PULM: ctab, no inc wob EXT: no edema DHP positive.

## 2014-05-24 NOTE — Assessment & Plan Note (Signed)
Continue meclizine, already tried home exercises.  Refer to ENT.  Path/phys d/w pt.  He agrees.

## 2014-05-31 ENCOUNTER — Encounter: Payer: BC Managed Care – PPO | Admitting: Internal Medicine

## 2014-05-31 DIAGNOSIS — Z0289 Encounter for other administrative examinations: Secondary | ICD-10-CM

## 2014-06-08 ENCOUNTER — Ambulatory Visit (INDEPENDENT_AMBULATORY_CARE_PROVIDER_SITE_OTHER): Payer: BC Managed Care – PPO | Admitting: Internal Medicine

## 2014-06-08 ENCOUNTER — Encounter: Payer: Self-pay | Admitting: Internal Medicine

## 2014-06-08 VITALS — BP 122/80 | HR 78 | Temp 98.3°F | Ht 67.5 in | Wt 182.0 lb

## 2014-06-08 DIAGNOSIS — E119 Type 2 diabetes mellitus without complications: Secondary | ICD-10-CM

## 2014-06-08 DIAGNOSIS — Z1211 Encounter for screening for malignant neoplasm of colon: Secondary | ICD-10-CM

## 2014-06-08 DIAGNOSIS — Z23 Encounter for immunization: Secondary | ICD-10-CM

## 2014-06-08 DIAGNOSIS — E785 Hyperlipidemia, unspecified: Secondary | ICD-10-CM

## 2014-06-08 DIAGNOSIS — Z0000 Encounter for general adult medical examination without abnormal findings: Secondary | ICD-10-CM

## 2014-06-08 LAB — LIPID PANEL
CHOLESTEROL: 228 mg/dL — AB (ref 0–200)
HDL: 42.3 mg/dL (ref >39.00)
LDL Cholesterol: 163 mg/dL — ABNORMAL HIGH (ref 0–99)
NonHDL: 185.7
TRIGLYCERIDES: 114 mg/dL (ref 0.0–149.0)
Total CHOL/HDL Ratio: 5
VLDL: 22.8 mg/dL (ref 0.0–40.0)

## 2014-06-08 LAB — CBC WITH DIFFERENTIAL/PLATELET
Basophils Absolute: 0 10*3/uL (ref 0.0–0.1)
Basophils Relative: 0.4 % (ref 0.0–3.0)
EOS ABS: 0.3 10*3/uL (ref 0.0–0.7)
Eosinophils Relative: 4.4 % (ref 0.0–5.0)
HCT: 45.9 % (ref 39.0–52.0)
Hemoglobin: 15.2 g/dL (ref 13.0–17.0)
LYMPHS PCT: 41 % (ref 12.0–46.0)
Lymphs Abs: 2.4 10*3/uL (ref 0.7–4.0)
MCHC: 33 g/dL (ref 30.0–36.0)
MCV: 88.9 fl (ref 78.0–100.0)
MONO ABS: 0.6 10*3/uL (ref 0.1–1.0)
Monocytes Relative: 9.7 % (ref 3.0–12.0)
NEUTROS PCT: 44.5 % (ref 43.0–77.0)
Neutro Abs: 2.7 10*3/uL (ref 1.4–7.7)
PLATELETS: 200 10*3/uL (ref 150.0–400.0)
RBC: 5.17 Mil/uL (ref 4.22–5.81)
RDW: 14.7 % (ref 11.5–15.5)
WBC: 6 10*3/uL (ref 4.0–10.5)

## 2014-06-08 LAB — COMPREHENSIVE METABOLIC PANEL
ALK PHOS: 64 U/L (ref 39–117)
ALT: 36 U/L (ref 0–53)
AST: 21 U/L (ref 0–37)
Albumin: 4.1 g/dL (ref 3.5–5.2)
BUN: 15 mg/dL (ref 6–23)
CALCIUM: 9.4 mg/dL (ref 8.4–10.5)
CHLORIDE: 103 meq/L (ref 96–112)
CO2: 27 mEq/L (ref 19–32)
Creatinine, Ser: 0.9 mg/dL (ref 0.4–1.5)
GFR: 108.35 mL/min (ref >60.00)
Glucose, Bld: 275 mg/dL — ABNORMAL HIGH (ref 70–99)
Potassium: 4.1 mEq/L (ref 3.5–5.1)
SODIUM: 135 meq/L (ref 135–145)
TOTAL PROTEIN: 7.4 g/dL (ref 6.0–8.3)
Total Bilirubin: 0.6 mg/dL (ref 0.2–1.2)

## 2014-06-08 LAB — HM DIABETES FOOT EXAM

## 2014-06-08 LAB — HEMOGLOBIN A1C: HEMOGLOBIN A1C: 10.8 % — AB (ref 4.6–6.5)

## 2014-06-08 LAB — T4, FREE: FREE T4: 0.83 ng/dL (ref 0.60–1.60)

## 2014-06-08 MED ORDER — SILDENAFIL CITRATE 20 MG PO TABS: 60.0000 mg | ORAL_TABLET | Freq: Every day | ORAL | Status: DC | PRN

## 2014-06-08 MED ORDER — ATORVASTATIN CALCIUM 40 MG PO TABS: 40.0000 mg | ORAL_TABLET | Freq: Every day | ORAL | Status: DC

## 2014-06-08 NOTE — Assessment & Plan Note (Signed)
Hopefully acceptable control Will add glipizide if not okay Foot exam all normal--no sensory changes

## 2014-06-08 NOTE — Assessment & Plan Note (Signed)
Will do fecal immunoassay Pneumovax today

## 2014-06-08 NOTE — Addendum Note (Signed)
Addended by: Sueanne MargaritaSMITH, Edgard Debord L on: 06/08/2014 09:16 AM   Modules accepted: Orders

## 2014-06-08 NOTE — Assessment & Plan Note (Signed)
Will start atorvastatin

## 2014-06-08 NOTE — Progress Notes (Signed)
Pre visit review using our clinic review tool, if applicable. No additional management support is needed unless otherwise documented below in the visit note. 

## 2014-06-08 NOTE — Progress Notes (Signed)
Subjective:    Patient ID: Douglas [name], male    DOB: Aug 05, 1946, 68 y.o.   MRN: [id number]  HPI Here for physical  Still having intermittent vertigo but it is better Will hear an echo in right ear at times Mild tinnitus Hearing is still okay  Went for the diabetic counseling Feels better Hasn't been checking his sugars though  Current Outpatient Prescriptions on File Prior to Visit  Medication Sig Dispense Refill  . meclizine (ANTIVERT) 25 MG tablet Take 1 tablet (25 mg total) by mouth 3 (three) times daily as needed for nausea (vertigo).  30 tablet  0  . metFORMIN (GLUCOPHAGE) 1000 MG tablet Take 1 tablet (1,000 mg total) by mouth 2 (two) times daily with a meal.  180 tablet  3  . sildenafil (VIAGRA) 100 MG tablet Take 1 tablet by mouth as needed 30-60 minutes before sex  5 tablet  2   No current facility-administered medications on file prior to visit.    No Known Allergies  Past Medical History  Diagnosis Date  . GERD (gastroesophageal reflux disease)   . Allergic rhinitis due to pollen   . Impaired fasting glucose   . Hyperlipidemia   . ED (erectile dysfunction)   . Type II or unspecified type diabetes mellitus without mention of complication, not stated as uncontrolled     Past Surgical History  Procedure Laterality Date  . Wrist surgery  1970's  . Kidney stone surgery  ~1990    Family History  Problem Relation Age of Onset  . Cancer Mother     History   Social History  . Marital Status: Divorced    Spouse Name: N/A    Number of Children: 3  . Years of Education: N/A   Occupational History  . [employer]   Social History Main Topics  . Smoking status: Never Smoker   . Smokeless tobacco: Never Used  . Alcohol Use: No  . Drug Use: No  . Sexual Activity: Not on file   Other Topics Concern  . Not on file   Social History Narrative  . No narrative on file   Review of Systems  Constitutional: Negative for fatigue and unexpected  weight change.       Wears seat belt  HENT: Positive for postnasal drip and tinnitus. Negative for dental problem and hearing loss.        Partials -- discussed keeping up with dentist  Eyes: Negative for visual disturbance.  Respiratory: Positive for cough. Negative for chest tightness and shortness of breath.        AM cough  Cardiovascular: Negative for chest pain, palpitations and leg swelling.  Gastrointestinal: Negative for nausea and vomiting.       Gets cramping with metformin--- has been taking 1/2 four times a day to keep up with it Bowels otherwise okay  Endocrine: Negative for cold intolerance, heat intolerance and polyuria.  Genitourinary: Positive for frequency. Negative for difficulty urinating.       Drinks a lot of water at work-- then frequent nocturia viagra helps but too expensive  Musculoskeletal: Positive for back pain. Negative for arthralgias and joint swelling.       Occasional "catch" in back--nothing persistent  Skin: Negative for rash.       No suspicious lesions Penile rash gone  Allergic/Immunologic: Positive for environmental allergies. Negative for immunocompromised state.  Neurological: Positive for numbness. Negative for dizziness, syncope, weakness, light-headedness and headaches.  Still mild vertigo Left hand numbness from CTS  Hematological: Negative for adenopathy. Does not bruise/bleed easily.  Psychiatric/Behavioral: Negative for sleep disturbance and dysphoric mood. The patient is not nervous/anxious.        Objective:   Physical Exam  Constitutional: He is oriented to person, place, and time. He appears well-developed and well-nourished. No distress.  HENT:  Head: Normocephalic and atraumatic.  Right Ear: External ear normal.  Left Ear: External ear normal.  Mouth/Throat: Oropharynx is clear and moist. No oropharyngeal exudate.  Eyes: Conjunctivae and EOM are normal. Pupils are equal, round, and reactive to light.  Neck: Normal  range of motion. Neck supple. No thyromegaly present.  Cardiovascular: Normal rate, regular rhythm, normal heart sounds and intact distal pulses.  Exam reveals no gallop.   No murmur heard. Pulmonary/Chest: Effort normal and breath sounds normal. No respiratory distress. He has no wheezes. He has no rales.  Abdominal: Soft. There is no tenderness.  Musculoskeletal: He exhibits no edema and no tenderness.  Lymphadenopathy:    He has no cervical adenopathy.  Neurological: He is alert and oriented to person, place, and time.  Skin: No rash noted. No erythema.  Psychiatric: He has a normal mood and affect. His behavior is normal.          Assessment & Plan:

## 2014-07-31 ENCOUNTER — Other Ambulatory Visit (INDEPENDENT_AMBULATORY_CARE_PROVIDER_SITE_OTHER): Payer: BC Managed Care – PPO

## 2014-07-31 DIAGNOSIS — Z1211 Encounter for screening for malignant neoplasm of colon: Secondary | ICD-10-CM

## 2014-07-31 LAB — FECAL OCCULT BLOOD, IMMUNOCHEMICAL: FECAL OCCULT BLD: NEGATIVE

## 2014-08-01 ENCOUNTER — Encounter: Payer: Self-pay | Admitting: *Deleted

## 2014-11-20 ENCOUNTER — Ambulatory Visit (INDEPENDENT_AMBULATORY_CARE_PROVIDER_SITE_OTHER): Payer: BLUE CROSS/BLUE SHIELD | Admitting: Internal Medicine

## 2014-11-20 ENCOUNTER — Ambulatory Visit (INDEPENDENT_AMBULATORY_CARE_PROVIDER_SITE_OTHER)
Admission: RE | Admit: 2014-11-20 | Discharge: 2014-11-20 | Disposition: A | Discharge status: Home / Self Care | Payer: BLUE CROSS/BLUE SHIELD | Source: Ambulatory Visit | Attending: Internal Medicine | Admitting: Internal Medicine

## 2014-11-20 ENCOUNTER — Encounter: Payer: Self-pay | Admitting: Internal Medicine

## 2014-11-20 VITALS — BP 120/80 | HR 88 | Temp 98.3°F | Wt 176.0 lb

## 2014-11-20 DIAGNOSIS — E1165 Type 2 diabetes mellitus with hyperglycemia: Secondary | ICD-10-CM

## 2014-11-20 DIAGNOSIS — M25562 Pain in left knee: Secondary | ICD-10-CM

## 2014-11-20 DIAGNOSIS — IMO0001 Reserved for inherently not codable concepts without codable children: Secondary | ICD-10-CM

## 2014-11-20 MED ORDER — GLIPIZIDE ER 5 MG PO TB24: 5.0000 mg | ORAL_TABLET | Freq: Every day | ORAL | Status: DC

## 2014-11-20 NOTE — Progress Notes (Signed)
   Subjective:    Patient ID: Douglas [name], male    DOB: August 05, 1946, 69 y.o.   MRN: [id number]  HPI Here due to left knee pain  Pulled back last month This is feeling better  Left knee has been hurting for 2 months Aching every month Radiates down the leg Gets pain inside with twisting Does think he may have banged it--but nothing severe No clear swelling No meds for this and didn't try brace It tightens up on him when he tries to bend  Never called back after his last labs Has been taking his metformin but never started the glipizide Hasn't been testing sugars  Current Outpatient Prescriptions on File Prior to Visit  Medication Sig Dispense Refill  . atorvastatin (LIPITOR) 40 MG tablet Take 1 tablet (40 mg total) by mouth daily. 90 tablet 3  . meclizine (ANTIVERT) 25 MG tablet Take 1 tablet (25 mg total) by mouth 3 (three) times daily as needed for nausea (vertigo). 30 tablet 0  . metFORMIN (GLUCOPHAGE) 1000 MG tablet Take 1 tablet (1,000 mg total) by mouth 2 (two) times daily with a meal. 180 tablet 3  . sildenafil (REVATIO) 20 MG tablet Take 3-5 tablets (60-100 mg total) by mouth daily as needed. 50 tablet 11   No current facility-administered medications on file prior to visit.    No Known Allergies  Past Medical History  Diagnosis Date  . GERD (gastroesophageal reflux disease)   . Allergic rhinitis due to pollen   . Impaired fasting glucose   . Hyperlipidemia   . ED (erectile dysfunction)   . Type II or unspecified type diabetes mellitus without mention of complication, not stated as uncontrolled     Past Surgical History  Procedure Laterality Date  . Wrist surgery  1970's  . Kidney stone surgery  ~1990    Family History  Problem Relation Age of Onset  . Cancer Mother     History   Social History  . Marital Status: Divorced    Spouse Name: N/A    Number of Children: 3  . Years of Education: N/A   Occupational History  . [employer]    Social History Main Topics  . Smoking status: Never Smoker   . Smokeless tobacco: Never Used  . Alcohol Use: No  . Drug Use: No  . Sexual Activity: Not on file   Other Topics Concern  . Not on file   Social History Narrative   No living will.   Wife to be health care POA   Would want resuscitation attempts   Wouldn't want tube feeds if cognitively unaware   Review of Systems  No other joint problems Trying to watch his portion size---weight is down a few pounds     Objective:   Physical Exam  Constitutional: He appears well-nourished. No distress.  Musculoskeletal: He exhibits no edema or tenderness.  No clear swelling in left knee Pain with full passive flexion Slight pain with lateral meniscus check--but not clear cut No ligament findings  Neurological:  No weakness in legs Normal passive ROM in left hip Normal gait  Psychiatric: He has a normal mood and affect. His behavior is normal.          Assessment & Plan:

## 2014-11-20 NOTE — Assessment & Plan Note (Signed)
Never started the glipizide Will start that now and check labs at follow up (though new med won't be completely reflected)

## 2014-11-20 NOTE — Progress Notes (Signed)
Pre visit review using our clinic review tool, if applicable. No additional management support is needed unless otherwise documented below in the visit note. 

## 2014-11-20 NOTE — Assessment & Plan Note (Signed)
I suspect mild meniscus tear Will try NSAIDS for now Consider cortisone shot if not improved Check x-ray today

## 2014-11-20 NOTE — Patient Instructions (Signed)
Please try aleve (naproxen) 2 tabs twice a day with food for the next 2-3 weeks. If your knee is not any better, I can try a cortisone shot in it at your next visit.

## 2014-11-21 ENCOUNTER — Encounter: Payer: Self-pay | Admitting: *Deleted

## 2014-12-11 ENCOUNTER — Encounter: Payer: Self-pay | Admitting: Internal Medicine

## 2014-12-11 ENCOUNTER — Ambulatory Visit (INDEPENDENT_AMBULATORY_CARE_PROVIDER_SITE_OTHER): Payer: BLUE CROSS/BLUE SHIELD | Admitting: Internal Medicine

## 2014-12-11 VITALS — BP 130/80 | HR 88 | Temp 98.6°F | Wt 178.0 lb

## 2014-12-11 DIAGNOSIS — E785 Hyperlipidemia, unspecified: Secondary | ICD-10-CM

## 2014-12-11 DIAGNOSIS — M25562 Pain in left knee: Secondary | ICD-10-CM

## 2014-12-11 DIAGNOSIS — IMO0001 Reserved for inherently not codable concepts without codable children: Secondary | ICD-10-CM

## 2014-12-11 DIAGNOSIS — N400 Enlarged prostate without lower urinary tract symptoms: Secondary | ICD-10-CM

## 2014-12-11 DIAGNOSIS — E1165 Type 2 diabetes mellitus with hyperglycemia: Secondary | ICD-10-CM

## 2014-12-11 DIAGNOSIS — N401 Enlarged prostate with lower urinary tract symptoms: Secondary | ICD-10-CM | POA: Insufficient documentation

## 2014-12-11 LAB — HEMOGLOBIN A1C: HEMOGLOBIN A1C: 11.6 % — AB (ref 4.6–6.5)

## 2014-12-11 MED ORDER — ATORVASTATIN CALCIUM 40 MG PO TABS: 40.0000 mg | ORAL_TABLET | Freq: Every day | ORAL | Status: DC

## 2014-12-11 MED ORDER — METFORMIN HCL 1000 MG PO TABS: 1000.0000 mg | ORAL_TABLET | Freq: Two times a day (BID) | ORAL | Status: DC

## 2014-12-11 MED ORDER — GLUCOSE BLOOD VI STRP: ORAL_STRIP | Status: AC

## 2014-12-11 MED ORDER — BAYER MICROLET LANCETS MISC: Status: AC

## 2014-12-11 NOTE — Assessment & Plan Note (Signed)
Now on the glipizide Hopefully A1c should be better--though won't reflect the improvement yet from glipizide

## 2014-12-11 NOTE — Progress Notes (Signed)
Pre visit review using our clinic review tool, if applicable. No additional management support is needed unless otherwise documented below in the visit note. 

## 2014-12-11 NOTE — Assessment & Plan Note (Signed)
No problems with statin--?some issues with compliance Lab Results  Component Value Date   LDLCALC 163* 06/08/2014

## 2014-12-11 NOTE — Progress Notes (Signed)
Subjective:    Patient ID: Douglas [name], male    DOB: 01-Sep-1946, 69 y.o.   MRN: [id number]  HPI Here for follow up for diabetes  Still having a bad time with his left knee Really hard going up stairs Careful with it--but still very painful meds not helpful  Did start the glipizide On both of them---metformin and this  Has noted 4-5 per night nocturia Some change in flow Started OTC prostate pill He feels this is helpful-- stream stronger and less nocturia  No chest pain No SOB No dizziness or syncope  Current Outpatient Prescriptions on File Prior to Visit  Medication Sig Dispense Refill  . atorvastatin (LIPITOR) 40 MG tablet Take 1 tablet (40 mg total) by mouth daily. 90 tablet 3  . glipiZIDE (GLUCOTROL XL) 5 MG 24 hr tablet Take 1 tablet (5 mg total) by mouth daily with breakfast. 90 tablet 3  . meclizine (ANTIVERT) 25 MG tablet Take 1 tablet (25 mg total) by mouth 3 (three) times daily as needed for nausea (vertigo). 30 tablet 0  . metFORMIN (GLUCOPHAGE) 1000 MG tablet Take 1 tablet (1,000 mg total) by mouth 2 (two) times daily with a meal. 180 tablet 3  . sildenafil (REVATIO) 20 MG tablet Take 3-5 tablets (60-100 mg total) by mouth daily as needed. 50 tablet 11   No current facility-administered medications on file prior to visit.    No Known Allergies  Past Medical History  Diagnosis Date  . GERD (gastroesophageal reflux disease)   . Allergic rhinitis due to pollen   . Impaired fasting glucose   . Hyperlipidemia   . ED (erectile dysfunction)   . Type II or unspecified type diabetes mellitus without mention of complication, not stated as uncontrolled     Past Surgical History  Procedure Laterality Date  . Wrist surgery  1970's  . Kidney stone surgery  ~1990    Family History  Problem Relation Age of Onset  . Cancer Mother     History   Social History  . Marital Status: Divorced    Spouse Name: N/A    Number of Children: 3  . Years of Education:  N/A   Occupational History  . [employer]   Social History Main Topics  . Smoking status: Never Smoker   . Smokeless tobacco: Never Used  . Alcohol Use: No  . Drug Use: No  . Sexual Activity: Not on file   Other Topics Concern  . Not on file   Social History Narrative   No living will.   Wife to be health care POA   Would want resuscitation attempts   Wouldn't want tube feeds if cognitively unaware   Review of Systems Appetite is fine Weight is stable Sleeps okay---other than episodic knee pain Has 2 exchange students staying with him[place]    Objective:   Physical Exam  Constitutional: He appears well-developed and well-nourished. No distress.  Neck: Normal range of motion. Neck supple. No thyromegaly present.  Cardiovascular: Normal rate, regular rhythm, normal heart sounds and intact distal pulses.  Exam reveals no gallop.   No murmur heard. Pulmonary/Chest: Effort normal and breath sounds normal. No respiratory distress. He has no wheezes. He has no rales.  Abdominal: Soft.  Musculoskeletal: He exhibits no edema or tenderness.  Lymphadenopathy:    He has no cervical adenopathy.  Skin:  No foot lesions  Psychiatric: He has a normal mood and affect. His behavior is normal.  Assessment & Plan:

## 2014-12-11 NOTE — Addendum Note (Signed)
Addended by: Sueanne MargaritaSMITH, DESHANNON L on: 12/11/2014 11:03 AM   Modules accepted: Orders, Medications

## 2014-12-11 NOTE — Assessment & Plan Note (Signed)
Still very bad Likely meniscus tear Will send to ortho

## 2014-12-11 NOTE — Assessment & Plan Note (Signed)
Mild symptoms that have improved with his OTC product

## 2014-12-13 ENCOUNTER — Other Ambulatory Visit: Payer: Self-pay | Admitting: Internal Medicine

## 2014-12-13 DIAGNOSIS — E1165 Type 2 diabetes mellitus with hyperglycemia: Principal | ICD-10-CM

## 2014-12-13 DIAGNOSIS — IMO0001 Reserved for inherently not codable concepts without codable children: Secondary | ICD-10-CM

## 2015-01-08 ENCOUNTER — Ambulatory Visit (INDEPENDENT_AMBULATORY_CARE_PROVIDER_SITE_OTHER): Payer: BLUE CROSS/BLUE SHIELD | Admitting: Endocrinology

## 2015-01-08 ENCOUNTER — Encounter: Payer: Self-pay | Admitting: Endocrinology

## 2015-01-08 ENCOUNTER — Other Ambulatory Visit: Payer: Self-pay | Admitting: Endocrinology

## 2015-01-08 VITALS — BP 138/84 | HR 90 | Resp 14 | Ht 67.5 in | Wt 178.8 lb

## 2015-01-08 DIAGNOSIS — IMO0001 Reserved for inherently not codable concepts without codable children: Secondary | ICD-10-CM

## 2015-01-08 DIAGNOSIS — E785 Hyperlipidemia, unspecified: Secondary | ICD-10-CM

## 2015-01-08 DIAGNOSIS — E1165 Type 2 diabetes mellitus with hyperglycemia: Principal | ICD-10-CM

## 2015-01-08 LAB — COMPREHENSIVE METABOLIC PANEL
ALK PHOS: 77 U/L (ref 39–117)
ALT: 28 U/L (ref 0–53)
AST: 18 U/L (ref 0–37)
Albumin: 4.4 g/dL (ref 3.5–5.2)
BUN: 11 mg/dL (ref 6–23)
CALCIUM: 9.8 mg/dL (ref 8.4–10.5)
CO2: 29 mEq/L (ref 19–32)
Chloride: 101 mEq/L (ref 96–112)
Creatinine, Ser: 0.93 mg/dL (ref 0.40–1.50)
GFR: 104.14 mL/min (ref >60.00)
GLUCOSE: 283 mg/dL — AB (ref 70–99)
Potassium: 4.1 mEq/L (ref 3.5–5.1)
SODIUM: 135 meq/L (ref 135–145)
TOTAL PROTEIN: 7.6 g/dL (ref 6.0–8.3)
Total Bilirubin: 0.3 mg/dL (ref 0.2–1.2)

## 2015-01-08 LAB — HM DIABETES FOOT EXAM: HM Diabetic Foot Exam: NORMAL

## 2015-01-08 LAB — GLUCOSE, POCT (MANUAL RESULT ENTRY): POC GLUCOSE: 312 mg/dL — AB (ref 70–99)

## 2015-01-08 MED ORDER — CANAGLIFLOZIN 100 MG PO TABS: 100.0000 mg | ORAL_TABLET | Freq: Every day | ORAL | Status: DC

## 2015-01-08 NOTE — Progress Notes (Signed)
Reason for visit-  Douglas [name] is a 69 y.o.-year-old male, referred by his PCP,  Karie Schwalbe, MD for management of Type 2 diabetes, uncontrolled, without complications. Associated hx of ED.    HPI- Patient has been diagnosed with diabetes ~2010. Recalls being initially on lifestyle modifications.  . he has not been on insulin before and does not wish to start injectable therapy.  Over the past year has been more regular with follow up appointments.    Pt is currently on a regimen of: - Metformin 1000 mg po bid- taking it as 500 mg TID instead - Glipizide XL  daily in morning   Last hemoglobin A1c was: Lab Results  Component Value Date   HGBA1C 11.6* 12/11/2014   HGBA1C 10.8* 06/08/2014   HGBA1C 11.6* 07/27/2013     Pt checks his sugars 0 a day . Uses ? glucometer. By recall/meter download/meter review they are:  PREMEAL Breakfast Lunch Dinner Bedtime Overall  Glucose range:     n/a  Mean/median:        POST-MEAL PC Breakfast PC Lunch PC Dinner  Glucose range:     Mean/median:       Hypoglycemia-  No lows. Lowest sugar was n/a; he has hypoglycemia awareness at 70.   Dietary habits- eats three times daily. Recently has been trying to limit carbs, sweetened beverages, sodas, desserts. Recently watching portion sizes and cutting back on diet sodas. Wife fixed meals.  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 "could do more at home- has exercise bike" Weight -  Wt Readings from Last 3 Encounters:  01/08/15 178 lb 12 oz (81.08 kg)  12/11/14 178 lb (80.74 kg)  11/20/14 176 lb (79.833 kg)    Diabetes Complications-  Nephropathy- No  CKD, last BUN/creatinine-  Lab Results  Component Value Date   BUN 15 06/08/2014   CREATININE 0.9 06/08/2014   Lab Results  Component Value Date   GFR 108.35 06/08/2014   No results found for: MICRALBCREAT   Retinopathy- No, Last DEE was in Dec 2015- got new bifocals Neuropathy- no numbness and tingling in  his feet. No known neuropathy.  Associated history - No CAD . No prior stroke. No hypothyroidism. his last TSH was  Lab Results  Component Value Date   TSH 0.57 07/27/2013    Hyperlipidemia-  his last set of lipids were- Currently on Lipitor- taking it in the morning. Tolerating well. Last levels were not controlled.   Lab Results  Component Value Date   CHOL 228* 06/08/2014   HDL 42.30 06/08/2014   LDLCALC 163* 06/08/2014   LDLDIRECT 188.3 07/27/2013   TRIG 114.0 06/08/2014   CHOLHDL 5 06/08/2014    Blood Pressure/HTN- Patient's blood pressure is relatively well controlled today.  Pt has FH of DM in daughter.  I have reviewed the patient's past medical history, family and social history, surgical history, medications and allergies.  Past Medical History  Diagnosis Date  . GERD (gastroesophageal reflux disease)   . Allergic rhinitis due to pollen   . Impaired fasting glucose   . Hyperlipidemia   . ED (erectile dysfunction)   . Type II or unspecified type diabetes mellitus without mention of complication, not stated as uncontrolled   . BPH (benign prostatic hypertrophy)    Past Surgical History  Procedure Laterality Date  . Wrist surgery  1970's  . Kidney stone surgery  ~1990   Family History  Problem Relation Age of Onset  . Cancer  Mother   . Diabetes Daughter    History   Social History  . Marital Status: Divorced    Spouse Name: N/A  . Number of Children: 3  . Years of Education: N/A   Occupational History  . [employer]   Social History Main Topics  . Smoking status: Never Smoker   . Smokeless tobacco: Never Used  . Alcohol Use: No  . Drug Use: No  . Sexual Activity: Not on file   Other Topics Concern  . Not on file   Social History Narrative   No living will.   Wife to be health care POA   Would want resuscitation attempts   Wouldn't want tube feeds if cognitively unaware   Current Outpatient Prescriptions on File Prior to Visit   Medication Sig Dispense Refill  . atorvastatin (LIPITOR) 40 MG tablet Take 1 tablet (40 mg total) by mouth daily. 90 tablet 3  . BAYER MICROLET LANCETS lancets Use as instructed to test blood sugar once daily dx:E11.65 100 each 3  . glipiZIDE (GLUCOTROL XL) 5 MG 24 hr tablet Take 1 tablet (5 mg total) by mouth daily with breakfast. 90 tablet 3  . glucose blood test strip Use as instructed to test blood sugar once daily dx: E11.65 100 each 3  . meclizine (ANTIVERT) 25 MG tablet Take 1 tablet (25 mg total) by mouth 3 (three) times daily as needed for nausea (vertigo). 30 tablet 0  . metFORMIN (GLUCOPHAGE) 1000 MG tablet Take 1 tablet (1,000 mg total) by mouth 2 (two) times daily with a meal. 180 tablet 3  . sildenafil (REVATIO) 20 MG tablet Take 3-5 tablets (60-100 mg total) by mouth daily as needed. 50 tablet 11   No current facility-administered medications on file prior to visit.   No Known Allergies   Review of Systems: [x]  complains of  [  ] denies General:   [ x ] Recent weight change [ x ] Fatigue  [  ] Loss of appetite Eyes: [  ]  Vision Difficulty [  ]  Eye pain ENT: [  ]  Hearing difficulty [  ]  Difficulty Swallowing CVS: [  ] Chest pain [  ]  Palpitations/Irregular Heart beat [  ]  Shortness of breath lying flat [  ] Swelling of legs Resp: [  ] Frequent Cough [  ] Shortness of Breath  [  ]  Wheezing GI: [  ] Heartburn  [  ] Nausea or Vomiting  [  ] Diarrhea [  ] Constipation  [  ] Abdominal Pain GU: [  ]  Polyuria  [ x ]  nocturia Bones/joints:  [  ]  Muscle aches  [  ] Joint Pain  [  ] Bone pain Skin/Hair/Nails: [  ]  Rash  [  ] New stretch marks [  ]  Itching [  ] Hair loss [  ]  Excessive hair growth Reproduction: [  ] Low sexual desire , [  ]  Women: Menstrual cycle problems [  ]  Women: Breast Discharge [  x] Men: Difficulty with erections [  ]  Men: Enlarged Breasts CNS: [  ] Frequent Headaches [x  ] Blurry vision [  ] Tremors [  ] Seizures [  ] Loss of consciousness [  ]  Localized weakness Endocrine: [  ]  Excess thirst [  ]  Feeling excessively hot [  ]  Feeling excessively cold Heme: [  ]  Easy bruising [  ]  Enlarged glands or lumps in neck Allergy: [  ]  Food allergies [ x ] Environmental allergies  PE: BP 138/84 mmHg  Pulse 90  Resp 14  Ht 5' 7.5" (1.715 m)  Wt 178 lb 12 oz (81.08 kg)  BMI 27.57 kg/m2  SpO2 95% Wt Readings from Last 3 Encounters:  01/08/15 178 lb 12 oz (81.08 kg)  12/11/14 178 lb (80.74 kg)  11/20/14 176 lb (79.833 kg)   GENERAL: No acute distress, well developed HEENT:  Eye exam shows normal external appearance. Oral exam shows normal mucosa .  NECK:   Neck exam shows no lymphadenopathy. No Carotids bruits. Thyroid is not enlarged and no nodules felt.  no acanthosis nigricans LUNGS:         Chest is symmetrical. Lungs are clear to auscultation.Marland Kitchen   HEART:         Heart sounds:  S1 and S2 are normal. No murmurs or clicks heard. ABDOMEN:  No Distention present. Liver and spleen are not palpable. No other mass or tenderness present.  EXTREMITIES:     There is no edema. 2+ DP pulses  NEUROLOGICAL:     Grossly intact.            Diabetic foot exam done with shoes and socks removed: Normal Monofilament testing bilaterally. No deformities of toes.  Nails  Not dystrophic. Skin normal color. No open wounds. Dry skin. Few cracks on soles. MUSCULOSKELETAL:       There is no enlargement or gross deformity of the joints.  SKIN:       No rash  ASSESSMENT AND PLAN: Problem List Items Addressed This Visit      Other   Hyperlipidemia    Last LDL 05/2014 not controlled on current statin. Encouraged him to take lipitor at night time. Recheck at next visit when fasting.       Diabetes mellitus type 2, uncontrolled, without complications - Primary    Reviewed goal sugars and A1c-reviewed risks of uncontrolled DM and long term complications.  Check sugars 2 x daily.  Bring meter to next appointment. He demonstrated successfully that he could  check his sugar.  Discussed dietary modifications.  Discussed need to start walking for exercise.   Discussed med options and he is reluctant to try basal insulin or any injectable therapy at this time.  Asked him to increase metformin to full dose of 1000 mg twice daily.  Continue current Glipizide XL.  Start Invokana 100 mg daily with BF- stay hydrated- labs today and in 2 weeks for assessing GFR. If sugars are not downtrending- then he is agreeable to consider starting insulin.  RTC 96month      Relevant Medications   canagliflozin (INVOKANA) 100 MG TABS tablet   Other Relevant Orders   Comprehensive metabolic panel   POCT Glucose (CBG) (Completed)        - Return to clinic in 1 mo with sugar log/meter.  Zianne Schubring [facility] 01/08/2015 11:24 AM

## 2015-01-08 NOTE — Assessment & Plan Note (Signed)
Reviewed goal sugars and A1c-reviewed risks of uncontrolled DM and long term complications.  Check sugars 2 x daily.  Bring meter to next appointment. He demonstrated successfully that he could check his sugar.  Discussed dietary modifications.  Discussed need to start walking for exercise.   Discussed med options and he is reluctant to try basal insulin or any injectable therapy at this time.  Asked him to increase metformin to full dose of 1000 mg twice daily.  Continue current Glipizide XL.  Start Invokana 100 mg daily with BF- stay hydrated- labs today and in 2 weeks for assessing GFR. If sugars are not downtrending- then he is agreeable to consider starting insulin.  RTC 79month

## 2015-01-08 NOTE — Patient Instructions (Signed)
Check sugars 2 x daily ( before breakfast and before supper).  Record them in a log book and bring that/meter to next appointment.   Change lipitor to night time.  Change metformin to 500 mg morning, 500 mg lunch and 1000 mg at night time.  Continue glipizide. Start Invokana at 100 mg daily with breakfast. Start walking for exercise.  Labs today and in 2 weeks for non fasting labs.  Please come back for a follow-up appointment in 1 month.

## 2015-01-08 NOTE — Progress Notes (Signed)
Pre visit review using our clinic review tool, if applicable. No additional management support is needed unless otherwise documented below in the visit note. 

## 2015-01-08 NOTE — Assessment & Plan Note (Signed)
Last LDL 05/2014 not controlled on current statin. Encouraged him to take lipitor at night time. Recheck at next visit when fasting.

## 2015-01-14 ENCOUNTER — Telehealth: Payer: Self-pay

## 2015-01-14 NOTE — Telephone Encounter (Signed)
Patient walked into the office stating that he needed to speak to me about his labwork and glucometer. I had previously called patient and left a message for him to return my call but patient thought message said come by the office. While in the office patient gave me his meter to write down his recent blood sugars. Patient asked how will he know if his sugar is too high or too low. I explained to patient that if he gets a reading under 70 or meter reads low that his hypoglycemic. He should eat a snack and recheck blood sugar in 15mins. If blood sugar is 180 or higher that would be hyperglycemic for him. But if meter is reading high it is to high for the meter to reading and he should recheck sugar and call the office if it continues to read high. Patient verbalized understanding. I also notified patient of Dr. Ephriam JenkinsPhadke's comments related to his lab results. Blood sugar readings placed on Dr. Ephriam JenkinsPhadke's desk for review.

## 2015-01-21 ENCOUNTER — Telehealth: Payer: Self-pay

## 2015-01-21 NOTE — Telephone Encounter (Signed)
Spoke to patient to notify him of Dr. Ephriam JenkinsPhadke's comments. Patient verbalized understanding. He stated that he has a "good" log for me this time and he will bring it with him when he comes in for his lab appt tomorrow. Told patient he can give the blood sugar log to the ladies at the front desk when he checks in for lab appointment. Patient again verbalized understanding.

## 2015-01-21 NOTE — Telephone Encounter (Signed)
-----   Message from Quentin Cornwalladhika P Phadke, MD sent at 01/17/2015  4:14 PM EST ----- Regarding: sugar review I have reviewed his sugars on recent sugar log between 2/23-2/29 Most of his sugars are looking great! Morning 131-165. Bedtime 112-122 One low of 67 at supper time recently.   I would continue checking sugars at the same frequency but change the times of checks to rotating lunch, supper, or bedtime.   If start to notice >3 lows ( below 70) in a week, then call me for further med adjustments. At that time, will likely decrease Glipizide.   Also, he is supposed to come back for labs in another week, please remind him of that.  thanks

## 2015-01-22 ENCOUNTER — Other Ambulatory Visit (INDEPENDENT_AMBULATORY_CARE_PROVIDER_SITE_OTHER): Payer: BLUE CROSS/BLUE SHIELD

## 2015-01-22 ENCOUNTER — Telehealth: Payer: Self-pay

## 2015-01-22 DIAGNOSIS — E1165 Type 2 diabetes mellitus with hyperglycemia: Secondary | ICD-10-CM

## 2015-01-22 DIAGNOSIS — IMO0001 Reserved for inherently not codable concepts without codable children: Secondary | ICD-10-CM

## 2015-01-22 LAB — BASIC METABOLIC PANEL
BUN: 15 mg/dL (ref 6–23)
CO2: 29 mEq/L (ref 19–32)
CREATININE: 0.96 mg/dL (ref 0.40–1.50)
Calcium: 10 mg/dL (ref 8.4–10.5)
Chloride: 100 mEq/L (ref 96–112)
GFR: 100.38 mL/min (ref >60.00)
Glucose, Bld: 133 mg/dL — ABNORMAL HIGH (ref 70–99)
Potassium: 3.9 mEq/L (ref 3.5–5.1)
Sodium: 134 mEq/L — ABNORMAL LOW (ref 135–145)

## 2015-01-22 NOTE — Telephone Encounter (Signed)
-----   Message from Quentin Cornwalladhika P Phadke, MD sent at 01/22/2015  1:26 PM EST ----- Regarding: med adjustments I have reviewed his most recent sugars log from late Feb to early march-   Sugars range between 74-149  Usually.   Please ask him to decrease Glipizide to 2.5 mg daily ( take half tablet of current pill). Continue other DM meds.   Fu as planned 3/22.  thanks

## 2015-01-22 NOTE — Telephone Encounter (Signed)
Spoke to patient to notify him of Dr. Phadke's comments. Patient verbalized understanding. 

## 2015-02-05 ENCOUNTER — Telehealth: Payer: Self-pay

## 2015-02-05 ENCOUNTER — Ambulatory Visit: Payer: BLUE CROSS/BLUE SHIELD | Admitting: Endocrinology

## 2015-02-05 NOTE — Telephone Encounter (Signed)
Okay to charge for no show. thanks

## 2015-02-05 NOTE — Telephone Encounter (Signed)
Called to inquire about not showing for appt, no answer, lvmom.

## 2015-02-20 ENCOUNTER — Ambulatory Visit (INDEPENDENT_AMBULATORY_CARE_PROVIDER_SITE_OTHER): Payer: BLUE CROSS/BLUE SHIELD | Admitting: Endocrinology

## 2015-02-20 ENCOUNTER — Encounter: Payer: Self-pay | Admitting: Endocrinology

## 2015-02-20 VITALS — BP 138/82 | HR 88 | Resp 14 | Ht 67.5 in | Wt 177.5 lb

## 2015-02-20 DIAGNOSIS — E785 Hyperlipidemia, unspecified: Secondary | ICD-10-CM | POA: Diagnosis not present

## 2015-02-20 DIAGNOSIS — IMO0001 Reserved for inherently not codable concepts without codable children: Secondary | ICD-10-CM

## 2015-02-20 DIAGNOSIS — E1165 Type 2 diabetes mellitus with hyperglycemia: Secondary | ICD-10-CM | POA: Diagnosis not present

## 2015-02-20 NOTE — Progress Notes (Signed)
Reason for visit-  Douglas [name] is a 69 y.o.-year-old male, for follow up management of Type 2 diabetes, uncontrolled, without complications. Associated hx of ED.  Last visit Feb 2016.    HPI- Patient has been diagnosed with diabetes ~2010. Recalls being initially on lifestyle modifications.  . he has not been on insulin before and does not wish to start injectable therapy.  Over the past year has been more regular with follow up appointments.    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   Hypoglycemia-  No lows recently after decreasing the Glipizide. Lowest sugar was n/a; he has hypoglycemia awareness at 70.   Dietary habits- eats three times daily. Recently has been trying to limit carbs, sweetened beverages, sodas, desserts. Recently watching portion sizes and cutting back on diet sodas. Wife fixed meals. Doing good with seldom dietary indiscretions. 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 "could do more at home- has exercise bike" Now working in the garden [name] Readings from Last 3 Encounters:  02/20/15 177 lb 8 oz (80.513 kg)  01/08/15 178 lb 12 oz (81.08 kg)  12/11/14 178 lb (80.74 kg)    Diabetes Complications-  Nephropathy- No  CKD, last BUN/creatinine-  Lab Results  Component Value Date   BUN 15  01/22/2015   CREATININE 0.96 01/22/2015   Lab Results  Component Value Date   GFR 100.38 01/22/2015   No results found for: MICRALBCREAT   Retinopathy- No, Last DEE was in Dec 2015- got new bifocals Neuropathy- no numbness and tingling in his feet. No known neuropathy.  Associated history - No CAD . No prior stroke. No hypothyroidism. his last TSH was  Lab Results  Component Value Date   TSH 0.57 07/27/2013    Hyperlipidemia-  his last set of lipids were- Currently on Lipitor- was taking it in the morning and Tolerating well. Last levels were not controlled.  Did not pick up the script for lipitor since Jan 2016. Lab Results  Component Value Date   CHOL 228* 06/08/2014   HDL 42.30 06/08/2014   LDLCALC 163* 06/08/2014   LDLDIRECT 188.3 07/27/2013   TRIG 114.0 06/08/2014   CHOLHDL 5 06/08/2014    Blood Pressure/HTN- Patient's blood pressure is relatively well controlled today.  I have reviewed the patient's past medical history, medications and allergies.   Current Outpatient Prescriptions on File Prior to Visit  Medication Sig Dispense Refill  . atorvastatin (LIPITOR) 40 MG tablet Take 1 tablet (40 mg total) by mouth daily. 90 tablet 3  . BAYER MICROLET LANCETS lancets Use as instructed to test blood sugar once daily dx:E11.65 100 each 3  . canagliflozin (INVOKANA) 100 MG TABS tablet Take 1 tablet (100  mg total) by mouth daily. 30 tablet 3  . glipiZIDE (GLUCOTROL XL) 5 MG 24 hr tablet Take 1 tablet (5 mg total) by mouth daily with breakfast. 90 tablet 3  . glucose blood test strip Use as instructed to test blood sugar once daily dx: E11.65 100 each 3  . meclizine (ANTIVERT) 25 MG tablet Take 1 tablet (25 mg total) by mouth 3 (three) times daily as needed for nausea (vertigo). 30 tablet 0  . metFORMIN (GLUCOPHAGE) 1000 MG tablet Take 1 tablet (1,000 mg total) by mouth 2 (two) times daily with a meal. 180 tablet 3  . sildenafil (REVATIO) 20 MG tablet Take 3-5 tablets (60-100 mg  total) by mouth daily as needed. 50 tablet 11   No current facility-administered medications on file prior to visit.   No Known Allergies   Review of Systems- [ x ]  Complains of    [  ]  denies [  ] Recent weight change [  ]  Fatigue [  ] polydipsia [ x ] polyuria [ x ]  nocturia [  ]  vision difficulty [  ] chest pain [  ] shortness of breath [  ] leg swelling [ x ] cough-occ- sinuses [  ] nausea/vomiting [  ] diarrhea [  ] constipation [  ] abdominal pain [  ]  tingling/numbness in extremities [  ]  concern with feet ( wounds/sores)   PE: BP 138/82 mmHg  Pulse 88  Resp 14  Ht 5' 7.5" (1.715 m)  Wt 177 lb 8 oz (80.513 kg)  BMI 27.37 kg/m2  SpO2 97% Wt Readings from Last 3 Encounters:  02/20/15 177 lb 8 oz (80.513 kg)  01/08/15 178 lb 12 oz (81.08 kg)  12/11/14 178 lb (80.74 kg)   Exam: deferred  ASSESSMENT AND PLAN: Problem List Items Addressed This Visit      Other   Hyperlipidemia    Last LDL 05/2014 not controlled on current statin. Encouraged him to take lipitor at night time. Asked him to pick up the Lipitor script from the pharmacy.         Diabetes mellitus type 2, uncontrolled, without complications - Primary    Reviewed goal sugars and A1c.  Check sugars 2 x daily. Patient reassurance given.  Bring meter to next appointments as well- congratulated on effort.  Encouraged him to follow his recent lifestyle efforts.  Continue metformin, Invokana and continue with Glipizide 2.5 mg daily. Notify if starts to have lows.    RTC 40month Update A1c at next visit.               - Return to clinic in 1 mo with sugar log/meter.  [name] 02/20/2015 8:51 AM

## 2015-02-20 NOTE — Patient Instructions (Signed)
Check sugars 2 x daily ( before breakfast and before supper).  Record them in a log book and bring that/meter to next appointment.   Continue Glipizide 2.5 mg daily, Invokana 100 mg daily and metformin 1000 mg twice daily.   Goal sugars at any time of the day are 90-140 for now.  If you get more lows below 70, then please let me know.   Please pick up the lipitor from the pharmacy. Take this medication at night.   Please come back for a follow-up appointment in 1 month.

## 2015-02-20 NOTE — Assessment & Plan Note (Signed)
Reviewed goal sugars and A1c.  Check sugars 2 x daily. Patient reassurance given.  Bring meter to next appointments as well- congratulated on effort.  Encouraged him to follow his recent lifestyle efforts.  Continue metformin, Invokana and continue with Glipizide 2.5 mg daily. Notify if starts to have lows.    RTC 4month Update A1c at next visit.

## 2015-02-20 NOTE — Progress Notes (Signed)
Pre visit review using our clinic review tool, if applicable. No additional management support is needed unless otherwise documented below in the visit note. 

## 2015-02-20 NOTE — Assessment & Plan Note (Signed)
Last LDL 05/2014 not controlled on current statin. Encouraged him to take lipitor at night time. Asked him to pick up the Lipitor script from the pharmacy.

## 2015-03-20 ENCOUNTER — Ambulatory Visit (INDEPENDENT_AMBULATORY_CARE_PROVIDER_SITE_OTHER)
Admission: RE | Admit: 2015-03-20 | Discharge: 2015-03-20 | Disposition: A | Discharge status: Home / Self Care | Payer: BLUE CROSS/BLUE SHIELD | Source: Ambulatory Visit | Attending: Primary Care | Admitting: Primary Care

## 2015-03-20 ENCOUNTER — Encounter: Payer: Self-pay | Admitting: Primary Care

## 2015-03-20 ENCOUNTER — Ambulatory Visit (INDEPENDENT_AMBULATORY_CARE_PROVIDER_SITE_OTHER): Payer: BLUE CROSS/BLUE SHIELD | Admitting: Primary Care

## 2015-03-20 VITALS — BP 126/74 | HR 106 | Temp 97.4°F | Ht 67.5 in | Wt 174.4 lb

## 2015-03-20 DIAGNOSIS — R319 Hematuria, unspecified: Secondary | ICD-10-CM

## 2015-03-20 DIAGNOSIS — R1010 Upper abdominal pain, unspecified: Secondary | ICD-10-CM | POA: Diagnosis not present

## 2015-03-20 DIAGNOSIS — R109 Unspecified abdominal pain: Secondary | ICD-10-CM

## 2015-03-20 LAB — POCT URINALYSIS DIPSTICK
Bilirubin, UA: NEGATIVE
KETONES UA: NEGATIVE
Leukocytes, UA: NEGATIVE
Nitrite, UA: NEGATIVE
PH UA: 5.5
Spec Grav, UA: 1.02
Urobilinogen, UA: 4

## 2015-03-20 MED ORDER — HYDROCODONE-ACETAMINOPHEN 5-325 MG PO TABS: 1.0000 | ORAL_TABLET | Freq: Four times a day (QID) | ORAL | Status: DC | PRN

## 2015-03-20 NOTE — Progress Notes (Signed)
Subjective:    Patient ID: Hannah BeatJohn W Ferraris, male    DOB: [birth year], 69 y.o.   MRN: [id number]  HPI  Mr. Montez MoritaCarter is a 69 year old male who presents today with a chief complaint of left flank pain and "dark" urine that has been present for 2 days. The pain is constant, but will intensify throughout the day, and describes the pain as achy and a "pinching" sensation. He has a history of kidney stone in the past when he was 21, which had to be surgically removed. Denies fevers, chills, nausea, vomiting today.  Review of Systems  Constitutional: Negative for fever and chills.  Respiratory: Negative for shortness of breath.   Cardiovascular: Negative for chest pain.  Gastrointestinal: Negative for abdominal pain.  Genitourinary: Positive for frequency, hematuria and flank pain. Negative for dysuria, penile swelling, scrotal swelling, difficulty urinating, penile pain and testicular pain.  Neurological: Negative for dizziness.       Past Medical History  Diagnosis Date  . GERD (gastroesophageal reflux disease)   . Allergic rhinitis due to pollen   . Impaired fasting glucose   . Hyperlipidemia   . ED (erectile dysfunction)   . Type II or unspecified type diabetes mellitus without mention of complication, not stated as uncontrolled   . BPH (benign prostatic hypertrophy)     History   Social History  . Marital Status: Divorced    Spouse Name: N/A  . Number of Children: 3  . Years of Education: N/A   Occupational History  . [employer]   Social History Main Topics  . Smoking status: Never Smoker   . Smokeless tobacco: Never Used  . Alcohol Use: No  . Drug Use: No  . Sexual Activity: Not on file   Other Topics Concern  . Not on file   Social History Narrative   No living will.   Wife to be health care POA   Would want resuscitation attempts   Wouldn't want tube feeds if cognitively unaware    Past Surgical History  Procedure Laterality Date  . Wrist surgery   1970's  . Kidney stone surgery  ~1990    Family History  Problem Relation Age of Onset  . Cancer Mother   . Diabetes Daughter     No Known Allergies  Current Outpatient Prescriptions on File Prior to Visit  Medication Sig Dispense Refill  . atorvastatin (LIPITOR) 40 MG tablet Take 1 tablet (40 mg total) by mouth daily. 90 tablet 3  . BAYER MICROLET LANCETS lancets Use as instructed to test blood sugar once daily dx:E11.65 100 each 3  . canagliflozin (INVOKANA) 100 MG TABS tablet Take 1 tablet (100 mg total) by mouth daily. 30 tablet 3  . glipiZIDE (GLUCOTROL XL) 5 MG 24 hr tablet Take 1 tablet (5 mg total) by mouth daily with breakfast. 90 tablet 3  . glucose blood test strip Use as instructed to test blood sugar once daily dx: E11.65 100 each 3  . meclizine (ANTIVERT) 25 MG tablet Take 1 tablet (25 mg total) by mouth 3 (three) times daily as needed for nausea (vertigo). 30 tablet 0  . metFORMIN (GLUCOPHAGE) 1000 MG tablet Take 1 tablet (1,000 mg total) by mouth 2 (two) times daily with a meal. 180 tablet 3  . sildenafil (REVATIO) 20 MG tablet Take 3-5 tablets (60-100 mg total) by mouth daily as needed. 50 tablet 11   No current facility-administered medications on file prior to visit.  BP 126/74 mmHg  Pulse 106  Temp(Src) 97.4 F (36.3 C) (Oral)  Ht 5' 7.5" (1.715 m)  Wt 174 lb 6.4 oz (79.107 kg)  BMI 26.90 kg/m2  SpO2 93%    Objective:   Physical Exam  Constitutional: He is oriented to person, place, and time.  Appears uncomfortable  Neck: Neck supple.  Cardiovascular: Normal rate and regular rhythm.   Pulmonary/Chest: Effort normal and breath sounds normal.  Abdominal: Soft. Bowel sounds are normal. There is no tenderness.  CVA tenderness to left side.  Lymphadenopathy:    He has no cervical adenopathy.  Neurological: He is alert and oriented to person, place, and time.  Skin: Skin is warm and dry.          Assessment & Plan:

## 2015-03-20 NOTE — Assessment & Plan Note (Addendum)
Left sided flank pain with hematuria x 2 days. Suspect renal stone. UA positive for blood, protein, and glucose. Negative for leuks, nitrites. No n/v, fevers. CT renal study ordered to observe for stones, insurance will not cover. KUB ordered instead RX for vicodin PRN pain. Will notify patient of results once KUB complete.

## 2015-03-20 NOTE — Patient Instructions (Signed)
Please stop by and speak with Douglas [name] on your way out to schedule your CT scan. I will call you with the results as I get them. You may take the hydrocodone every 6 hours as needed for pain. Use the strainer daily to look for stone. Call me if you develop fevers, nausea, vomiting.  Kidney Stones Kidney stones (urolithiasis) are deposits that form inside your kidneys. The intense pain is caused by the stone moving through the urinary tract. When the stone moves, the ureter goes into spasm around the stone. The stone is usually passed in the urine.  CAUSES   A disorder that makes certain neck glands produce too much parathyroid hormone (primary hyperparathyroidism).  A buildup of uric acid crystals, similar to gout in your joints.  Narrowing (stricture) of the ureter.  A kidney obstruction present at birth (congenital obstruction).  Previous surgery on the kidney or ureters.  Numerous kidney infections. SYMPTOMS   Feeling sick to your stomach (nauseous).  Throwing up (vomiting).  Blood in the urine (hematuria).  Pain that usually spreads (radiates) to the groin.  Frequency or urgency of urination. DIAGNOSIS   Taking a history and physical exam.  Blood or urine tests.  CT scan.  Occasionally, an examination of the inside of the urinary bladder (cystoscopy) is performed. TREATMENT   Observation.  Increasing your fluid intake.  Extracorporeal shock wave lithotripsy--This is a noninvasive procedure that uses shock waves to break up kidney stones.  Surgery may be needed if you have severe pain or persistent obstruction. There are various surgical procedures. Most of the procedures are performed with the use of small instruments. Only small incisions are needed to accommodate these instruments, so recovery time is minimized. The size, location, and chemical composition are all important variables that will determine the proper choice of action for you. Talk to your health care  provider to better understand your situation so that you will minimize the risk of injury to yourself and your kidney.  HOME CARE INSTRUCTIONS   Drink enough water and fluids to keep your urine clear or pale yellow. This will help you to pass the stone or stone fragments.  Strain all urine through the provided strainer. Keep all particulate matter and stones for your health care provider to see. The stone causing the pain may be as small as a grain of salt. It is very important to use the strainer each and every time you pass your urine. The collection of your stone will allow your health care provider to analyze it and verify that a stone has actually passed. The stone analysis will often identify what you can do to reduce the incidence of recurrences.  Only take over-the-counter or prescription medicines for pain, discomfort, or fever as directed by your health care provider.  Make a follow-up appointment with your health care provider as directed.  Get follow-up X-rays if required. The absence of pain does not always mean that the stone has passed. It may have only stopped moving. If the urine remains completely obstructed, it can cause loss of kidney function or even complete destruction of the kidney. It is your responsibility to make sure X-rays and follow-ups are completed. Ultrasounds of the kidney can show blockages and the status of the kidney. Ultrasounds are not associated with any radiation and can be performed easily in a matter of minutes. SEEK MEDICAL CARE IF:  You experience pain that is progressive and unresponsive to any pain medicine you have been prescribed. SEEK  IMMEDIATE MEDICAL CARE IF:   Pain cannot be controlled with the prescribed medicine.  You have a fever or shaking chills.  The severity or intensity of pain increases over 18 hours and is not relieved by pain medicine.  You develop a new onset of abdominal pain.  You feel faint or pass out.  You are unable to  urinate. MAKE SURE YOU:   Understand these instructions.  Will watch your condition.  Will get help right away if you are not doing well or get worse. Document Released: 11/02/2005 Document Revised: 07/05/2013 Document Reviewed: 04/05/2013 [facility] Patient Information 2015 [employer] [place]. This information is not intended to replace advice given to you by your health care provider. Make sure you discuss any questions you have with your health care provider.

## 2015-03-21 ENCOUNTER — Inpatient Hospital Stay: Admission: RE | Admit: 2015-03-21 | Payer: BLUE CROSS/BLUE SHIELD | Source: Ambulatory Visit

## 2015-03-21 ENCOUNTER — Other Ambulatory Visit: Payer: Self-pay | Admitting: Primary Care

## 2015-03-21 DIAGNOSIS — N200 Calculus of kidney: Secondary | ICD-10-CM

## 2015-03-21 NOTE — Addendum Note (Signed)
Addended by: Doreene NestLARK, Murrell Dome K on: 03/21/2015 03:21 PM   Modules accepted: Orders, SmartSet

## 2015-04-16 ENCOUNTER — Encounter: Payer: Self-pay | Admitting: Endocrinology

## 2015-04-16 ENCOUNTER — Ambulatory Visit (INDEPENDENT_AMBULATORY_CARE_PROVIDER_SITE_OTHER): Payer: BLUE CROSS/BLUE SHIELD | Admitting: Endocrinology

## 2015-04-16 VITALS — BP 122/80 | HR 86 | Resp 16 | Ht 67.5 in | Wt 175.5 lb

## 2015-04-16 DIAGNOSIS — E785 Hyperlipidemia, unspecified: Secondary | ICD-10-CM

## 2015-04-16 DIAGNOSIS — E1165 Type 2 diabetes mellitus with hyperglycemia: Secondary | ICD-10-CM | POA: Diagnosis not present

## 2015-04-16 DIAGNOSIS — IMO0001 Reserved for inherently not codable concepts without codable children: Secondary | ICD-10-CM

## 2015-04-16 LAB — COMPREHENSIVE METABOLIC PANEL
ALK PHOS: 51 U/L (ref 39–117)
ALT: 27 U/L (ref 0–53)
AST: 16 U/L (ref 0–37)
Albumin: 4.3 g/dL (ref 3.5–5.2)
BUN: 15 mg/dL (ref 6–23)
CO2: 23 mEq/L (ref 19–32)
Calcium: 9.3 mg/dL (ref 8.4–10.5)
Chloride: 104 mEq/L (ref 96–112)
Creatinine, Ser: 0.94 mg/dL (ref 0.40–1.50)
GFR: 102.78 mL/min (ref >60.00)
Glucose, Bld: 161 mg/dL — ABNORMAL HIGH (ref 70–99)
Potassium: 3.8 mEq/L (ref 3.5–5.1)
SODIUM: 136 meq/L (ref 135–145)
TOTAL PROTEIN: 7.3 g/dL (ref 6.0–8.3)
Total Bilirubin: 0.3 mg/dL (ref 0.2–1.2)

## 2015-04-16 LAB — LIPID PANEL
CHOL/HDL RATIO: 3
Cholesterol: 138 mg/dL (ref 0–200)
HDL: 50 mg/dL (ref >39.00)
LDL Cholesterol: 72 mg/dL (ref 0–99)
NonHDL: 88
Triglycerides: 81 mg/dL (ref 0.0–149.0)
VLDL: 16.2 mg/dL (ref 0.0–40.0)

## 2015-04-16 LAB — MICROALBUMIN / CREATININE URINE RATIO
Creatinine,U: 78.8 mg/dL
MICROALB UR: 1.3 mg/dL (ref 0.0–1.9)
MICROALB/CREAT RATIO: 1.6 mg/g (ref 0.0–30.0)

## 2015-04-16 LAB — HEMOGLOBIN A1C: Hgb A1c MFr Bld: 6.9 % — ABNORMAL HIGH (ref 4.6–6.5)

## 2015-04-16 NOTE — Assessment & Plan Note (Signed)
Last LDL 05/2014 not controlled on current statin. Update fasting lipids today.

## 2015-04-16 NOTE — Patient Instructions (Signed)
Labs today. Continue same medications for your sugars.  If start to get >3 lower end numbers of <80 in 1 week, then please give me  A call so that the Glipizide can be adjusted.   Please come back for a follow-up appointment in 3 months

## 2015-04-16 NOTE — Assessment & Plan Note (Signed)
Reviewed goal sugars and A1c.  Check sugars 2 x daily. Patient reassurance given.  Bring meter to next appointments as well- congratulated on effort.  Encouraged him to follow his recent lifestyle efforts.  Continue metformin, Invokana and continue with Glipizide 5 mg daily. Notify if starts to have lows. Discussed today about trying to take off Glipizide and increasing the Invokana, however he is not interested in making this change as his numbers are doing good on current therapy.  Labs today.

## 2015-04-16 NOTE — Progress Notes (Signed)
Reason for visit-  Douglas [name] is a 69 y.o.-year-old male, for follow up management of Type 2 diabetes, uncontrolled, without complications. Associated hx of ED.  Last visit April 2016.    HPI- Patient has been diagnosed with diabetes ~2010. Recalls being initially on lifestyle modifications.  . he has not been on insulin before and does not wish to start injectable therapy.  Over the past year has been more regular with follow up appointments.    Pt is currently on a regimen of: - Metformin 1000 mg po bid - Glipizide XL 5mg  daily in morning ( decreased since last visit due to lows)>>was at 2.5mg  daily , but this time since last refill 1 week ago as has been getting XL form so taking full tablet and tolerating well -Invokana 100 mg daily ( start Feb 2016)   Last hemoglobin A1c was: Lab Results  Component Value Date   HGBA1C 11.6* 12/11/2014   HGBA1C 10.8* 06/08/2014   HGBA1C 11.6* 07/27/2013     Pt checks his sugars 2 a day . Secondary school teacherUses Bayer Contour glucometer. By meter review they are: - PREMEAL Breakfast Lunch Dinner Bedtime Overall  Glucose range:     88-145  Mean/median:        POST-MEAL PC Breakfast PC Lunch PC Dinner  Glucose range:     Mean/median:       Hypoglycemia-  No lows recently recently. Lowest sugar was n/a; he has hypoglycemia awareness at 70.   Dietary habits- eats three times daily. Recently has been trying to limit carbs, sweetened beverages, sodas, desserts. Recently watching portion sizes and cutting back on diet sodas. Wife fixed meals. Doing good with seldom dietary indiscretions. 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 "could do more at home- has exercise bike" Now working in the garden [employer] InternationalWeight - down Hartford FinancialWt Readings from Last 3 Encounters:  04/16/15 175 lb 8 oz (79.606 kg)  03/20/15 174 lb 6.4 oz (79.107 kg)  02/20/15 177 lb 8 oz (80.513 kg)    Diabetes Complications-  Nephropathy- No  CKD, last BUN/creatinine-  Lab Results   Component Value Date   BUN 15 01/22/2015   CREATININE 0.96 01/22/2015   Lab Results  Component Value Date   GFR 100.38 01/22/2015   No results found for: MICRALBCREAT   Retinopathy- No, Last DEE was in Dec 2015- got new bifocals Neuropathy- no numbness and tingling in his feet. No known neuropathy.  Associated history - No CAD . No prior stroke. No hypothyroidism. his last TSH was  Lab Results  Component Value Date   TSH 0.57 07/27/2013    Hyperlipidemia-  his last set of lipids were- Currently on Lipitor- was taking it in the morning and Tolerating well. Last levels were not controlled.  Reports that he never got off this medication, just didn't recognize the name of the generic that he has been taking when asked at last visit.takes it at night time. Lab Results  Component Value Date   CHOL 228* 06/08/2014   HDL 42.30 06/08/2014   LDLCALC 163* 06/08/2014   LDLDIRECT 188.3 07/27/2013   TRIG 114.0 06/08/2014   CHOLHDL 5 06/08/2014    Blood Pressure/HTN- Patient's blood pressure is  well controlled today.  I have reviewed the patient's past medical history, medications and allergies.   Current Outpatient Prescriptions on File Prior to Visit  Medication Sig Dispense Refill  . atorvastatin (LIPITOR) 40 MG tablet Take 1 tablet (40 mg total) by mouth daily. 90  tablet 3  . BAYER MICROLET LANCETS lancets Use as instructed to test blood sugar once daily dx:E11.65 100 each 3  . canagliflozin (INVOKANA) 100 MG TABS tablet Take 1 tablet (100 mg total) by mouth daily. 30 tablet 3  . glipiZIDE (GLUCOTROL XL) 5 MG 24 hr tablet Take 1 tablet (5 mg total) by mouth daily with breakfast. 90 tablet 3  . glucose blood test strip Use as instructed to test blood sugar once daily dx: E11.65 100 each 3  . HYDROcodone-acetaminophen (NORCO/VICODIN) 5-325 MG per tablet Take 1-2 tablets by mouth every 6 (six) hours as needed for moderate pain. 30 tablet 0  . meclizine (ANTIVERT) 25 MG tablet Take 1  tablet (25 mg total) by mouth 3 (three) times daily as needed for nausea (vertigo). 30 tablet 0  . metFORMIN (GLUCOPHAGE) 1000 MG tablet Take 1 tablet (1,000 mg total) by mouth 2 (two) times daily with a meal. 180 tablet 3  . sildenafil (REVATIO) 20 MG tablet Take 3-5 tablets (60-100 mg total) by mouth daily as needed. 50 tablet 11   No current facility-administered medications on file prior to visit.   No Known Allergies   Review of Systems- [ x ]  Complains of    [  ]  denies [  ] Recent weight change [  ]  Fatigue [  ] polydipsia [  ] polyuria [  ]  nocturia [  ]  vision difficulty [  ] chest pain [  ] shortness of breath [  ] leg swelling [  ] cough [  ] nausea/vomiting [  ] diarrhea [  ] constipation [  ] abdominal pain [  ]  tingling/numbness in extremities [  ]  concern with feet ( wounds/sores)   PE: BP 122/80 mmHg  Pulse 86  Resp 16  Ht 5' 7.5" (1.715 m)  Wt 175 lb 8 oz (79.606 kg)  BMI 27.07 kg/m2  SpO2 97% Wt Readings from Last 3 Encounters:  04/16/15 175 lb 8 oz (79.606 kg)  03/20/15 174 lb 6.4 oz (79.107 kg)  02/20/15 177 lb 8 oz (80.513 kg)   Exam: deferred  ASSESSMENT AND PLAN: Problem List Items Addressed This Visit      Other   Hyperlipidemia    Last LDL 05/2014 not controlled on current statin. Update fasting lipids today.          Relevant Orders   Comprehensive metabolic panel   Hemoglobin A1c   Microalbumin / creatinine urine ratio   Lipid panel   Diabetes mellitus type 2, uncontrolled, without complications - Primary    Reviewed goal sugars and A1c.  Check sugars 2 x daily. Patient reassurance given.  Bring meter to next appointments as well- congratulated on effort.  Encouraged him to follow his recent lifestyle efforts.  Continue metformin, Invokana and continue with Glipizide 5 mg daily. Notify if starts to have lows. Discussed today about trying to take off Glipizide and increasing the Invokana, however he is not interested in making this  change as his numbers are doing good on current therapy.  Labs today.             Relevant Orders   Comprehensive metabolic panel   Hemoglobin A1c   Microalbumin / creatinine urine ratio   Lipid panel        - Return to clinic in 3 mo with sugar log/meter.  Veldon Wager [facility] 04/16/2015 8:31 AM

## 2015-04-16 NOTE — Progress Notes (Signed)
Pre visit review using our clinic review tool, if applicable. No additional management support is needed unless otherwise documented below in the visit note. 

## 2015-05-07 ENCOUNTER — Other Ambulatory Visit: Payer: Self-pay | Admitting: Endocrinology

## 2015-05-08 ENCOUNTER — Telehealth: Payer: Self-pay | Admitting: Internal Medicine

## 2015-05-08 ENCOUNTER — Other Ambulatory Visit: Payer: Self-pay

## 2015-05-08 DIAGNOSIS — IMO0001 Reserved for inherently not codable concepts without codable children: Secondary | ICD-10-CM

## 2015-05-08 DIAGNOSIS — E1165 Type 2 diabetes mellitus with hyperglycemia: Principal | ICD-10-CM

## 2015-05-08 MED ORDER — CANAGLIFLOZIN 100 MG PO TABS: 100.0000 mg | ORAL_TABLET | Freq: Every day | ORAL | Status: DC

## 2015-05-08 NOTE — Telephone Encounter (Signed)
Pt left v/m Dr Welford Roche is gone and pt has f/u appt with Dr Elvera Lennox on 07/15/15; pt was advised to get invokana from PCP until seen by Dr Elvera Lennox. Dr Alphonsus Sias last saw pt 12/11/2014.Please advise. [facility] pharmacy.

## 2015-05-08 NOTE — Telephone Encounter (Signed)
Pt is Dr. Ephriam Jenkins pt has a fu ov with Dr. Elvera Lennox  But only has one pill left of his invokana and is out of refills - what can we do? Please advise? Dr. Welford Roche is already gone.

## 2015-05-08 NOTE — Telephone Encounter (Signed)
I contacted the pt and left a voicemail advising him Dr. Elvera Lennox would need to see him first before any refills could be given. Pt advised to contact Dr. Karle Starch office to request the refill until he is seen by Dr. Elvera Lennox.

## 2015-05-10 ENCOUNTER — Telehealth: Payer: Self-pay | Admitting: Internal Medicine

## 2015-05-10 DIAGNOSIS — E1165 Type 2 diabetes mellitus with hyperglycemia: Principal | ICD-10-CM

## 2015-05-10 DIAGNOSIS — IMO0001 Reserved for inherently not codable concepts without codable children: Secondary | ICD-10-CM

## 2015-05-10 NOTE — Telephone Encounter (Signed)
Pt advised to get his medication through his PCP until he is able to see Dr. Elvera Lennox. Pt has never been seen by Dr. Elvera Lennox. Dr. Alphonsus Sias sent rx on 05/08/15 to [facility].

## 2015-05-10 NOTE — Telephone Encounter (Signed)
Pt called and needs refills on indvokana , please send to [facility]

## 2015-07-15 ENCOUNTER — Ambulatory Visit (INDEPENDENT_AMBULATORY_CARE_PROVIDER_SITE_OTHER): Payer: BLUE CROSS/BLUE SHIELD | Admitting: Internal Medicine

## 2015-07-15 ENCOUNTER — Encounter: Payer: Self-pay | Admitting: Internal Medicine

## 2015-07-15 ENCOUNTER — Other Ambulatory Visit (INDEPENDENT_AMBULATORY_CARE_PROVIDER_SITE_OTHER): Payer: BLUE CROSS/BLUE SHIELD | Admitting: *Deleted

## 2015-07-15 VITALS — BP 118/70 | HR 90 | Temp 97.5°F | Resp 12 | Ht 67.5 in | Wt 173.6 lb

## 2015-07-15 DIAGNOSIS — E1165 Type 2 diabetes mellitus with hyperglycemia: Secondary | ICD-10-CM | POA: Diagnosis not present

## 2015-07-15 DIAGNOSIS — N521 Erectile dysfunction due to diseases classified elsewhere: Secondary | ICD-10-CM | POA: Diagnosis not present

## 2015-07-15 DIAGNOSIS — E1159 Type 2 diabetes mellitus with other circulatory complications: Secondary | ICD-10-CM

## 2015-07-15 DIAGNOSIS — IMO0001 Reserved for inherently not codable concepts without codable children: Secondary | ICD-10-CM

## 2015-07-15 DIAGNOSIS — E1142 Type 2 diabetes mellitus with diabetic polyneuropathy: Secondary | ICD-10-CM | POA: Insufficient documentation

## 2015-07-15 LAB — POCT GLYCOSYLATED HEMOGLOBIN (HGB A1C): Hemoglobin A1C: 6.8

## 2015-07-15 NOTE — Patient Instructions (Signed)
Please continue: - Metformin 1000 mg 2x a day, with meals - Invokana 100 mg in am - Glipizide XL 5 mg in am  Please come back for a follow-up appointment in 3 months with your sugar log.  PATIENT INSTRUCTIONS FOR TYPE 2 DIABETES:  **Please join MyChart!** - see attached instructions about how to join if you have not done so already.  DIET AND EXERCISE Diet and exercise is an important part of diabetic treatment.  We recommended aerobic exercise in the form of brisk walking (working between 40-60% of maximal aerobic capacity, similar to brisk walking) for 150 minutes per week (such as 30 minutes five days per week) along with 3 times per week performing 'resistance' training (using various gauge rubber tubes with handles) 5-10 exercises involving the major muscle groups (upper body, lower body and core) performing 10-15 repetitions (or near fatigue) each exercise. Start at half the above goal but build slowly to reach the above goals. If limited by weight, joint pain, or disability, we recommend daily walking in a swimming pool with water up to waist to reduce pressure from joints while allow for adequate exercise.    BLOOD GLUCOSES Monitoring your blood glucoses is important for continued management of your diabetes. Please check your blood glucoses 2-4 times a day: fasting, before meals and at bedtime (you can rotate these measurements - e.g. one day check before the 3 meals, the next day check before 2 of the meals and before bedtime, etc.).   HYPOGLYCEMIA (low blood sugar) Hypoglycemia is usually a reaction to not eating, exercising, or taking too much insulin/ other diabetes drugs.  Symptoms include tremors, sweating, hunger, confusion, headache, etc. Treat IMMEDIATELY with 15 grams of Carbs: . 4 glucose tablets .  cup regular juice/soda . 2 tablespoons raisins . 4 teaspoons sugar . 1 tablespoon honey Recheck blood glucose in 15 mins and repeat above if still symptomatic/blood glucose  <100.  RECOMMENDATIONS TO REDUCE YOUR RISK OF DIABETIC COMPLICATIONS: * Take your prescribed MEDICATION(S) * Follow a DIABETIC diet: Complex carbs, fiber rich foods, (monounsaturated and polyunsaturated) fats * AVOID saturated/trans fats, high fat foods, >2,300 mg salt per day. * EXERCISE at least 5 times a week for 30 minutes or preferably daily.  * DO NOT SMOKE OR DRINK more than 1 drink a day. * Check your FEET every day. Do not wear tightfitting shoes. Contact us if you develop an ulcer * See your EYE doctor once a year or more if needed * Get a FLU shot once a year * Get a PNEUMONIA vaccine once before and once after age 61 years  GOALS:  * Your Hemoglobin A1c of <7%  * fasting sugars need to be <130 * after meals sugars need to be <180 (2h after you start eating) * Your Systolic BP should be 140 or lower  * Your Diastolic BP should be 80 or lower  * Your HDL (Good Cholesterol) should be 40 or higher  * Your LDL (Bad Cholesterol) should be 100 or lower. * Your Triglycerides should be 150 or lower  * Your Urine microalbumin (kidney function) should be <30 * Your Body Mass Index should be 25 or lower    Please consider the following ways to cut down carbs and fat and increase fiber and micronutrients in your diet: - substitute whole grain for white bread or pasta - substitute brown rice for white rice - substitute 90-calorie flat bread pieces for slices of bread when possible - substitute sweet  potatoes or yams for white potatoes - substitute humus for margarine - substitute tofu for cheese when possible - substitute almond or rice milk for regular milk (would not drink soy milk daily due to concern for soy estrogen influence on breast cancer risk) - substitute dark chocolate for other sweets when possible - substitute water - can add lemon or orange slices for taste - for diet sodas (artificial sweeteners will trick your body that you can eat sweets without getting calories and  will lead you to overeating and weight gain in the long run) - do not skip breakfast or other meals (this will slow down the metabolism and will result in more weight gain over time)  - can try smoothies made from fruit and almond/rice milk in am instead of regular breakfast - can also try old-fashioned (not instant) oatmeal made with almond/rice milk in am - order the dressing on the side when eating salad at a restaurant (pour less than half of the dressing on the salad) - eat as little meat as possible - can try juicing, but should not forget that juicing will get rid of the fiber, so would alternate with eating raw veg./fruits or drinking smoothies - use as little oil as possible, even when using olive oil - can dress a salad with a mix of balsamic vinegar and lemon juice, for e.g. - use agave nectar, stevia sugar, or regular sugar rather than artificial sweateners - steam or broil/roast veggies  - snack on veggies/fruit/nuts (unsalted, preferably) when possible, rather than processed foods - reduce or eliminate aspartame in diet (it is in diet sodas, chewing gum, etc) Read the labels!  Try to read Dr. Janene Harvey book: "Program for Reversing Diabetes" for other ideas for healthy eating.

## 2015-07-15 NOTE — Progress Notes (Signed)
Patient ID: Douglas [name], male   DOB: [birth year], 69 y.o.   MRN: [id number]  HPI: Douglas [name] is a 69 y.o.-year-old male, referred by his PCP, Dr. Alphonsus Sias, for management of DM2, dx in 2010, non-insulin-dependent, uncontrolled, with complications (ED). He previously saw Dr Welford Roche, who since left practice.  Last hemoglobin A1c was: Lab Results  Component Value Date   HGBA1C 6.9* 04/16/2015   HGBA1C 11.6* 12/11/2014   HGBA1C 10.8* 06/08/2014   Pt is on a regimen of: - Metformin 1000 mg 2x a day, with meals - Invokana 100 mg in am - Glipizide XL 5 mg in am  Pt checks his sugars 1x a day and they are: - am: 92-130, 183, 189 - 2h after b'fast: n/c - before lunch: n/c - 2h after lunch: n/c - before dinner: n/c - 2h after dinner: n/c - bedtime: n/c - nighttime: n/c No lows. Lowest sugar was 89; ? has hypoglycemia awareness. Highest sugar was 189.  Glucometer: [employer]  [employer] are (usually cooks his own meals): - Breakfast: scrambled eggs + sausage/bologna + coffee - Lunch: hot dog; burger - Dinner: salad + baked chicken - Snacks: fruit: bananas, apples, grapes  - no CKD, last BUN/creatinine:  Lab Results  Component Value Date   BUN 15 04/16/2015   CREATININE 0.94 04/16/2015   - last set of lipids: Lab Results  Component Value Date   CHOL 138 04/16/2015   HDL 50.00 04/16/2015   LDLCALC 72 04/16/2015   LDLDIRECT 188.3 07/27/2013   TRIG 81.0 04/16/2015   CHOLHDL 3 04/16/2015  On Lipitor. - last eye exam was in 2016. No DR. + cataract - low grade. - no numbness and tingling in his feet. Last foot exam normal 12/2014.  Pt has FH of DM in daughter (? Type).  ROS: Constitutional: no weight gain/loss, no fatigue, no subjective hyperthermia/hypothermia Eyes: no blurry vision, no xerophthalmia ENT: no sore throat, no nodules palpated in throat, no dysphagia/odynophagia, no hoarseness Cardiovascular: no CP/SOB/palpitations/leg swelling Respiratory: no  cough/SOB Gastrointestinal: no N/V/D/C Musculoskeletal: no muscle/joint aches Skin: no rashes Neurological: no tremors/numbness/tingling/dizziness Psychiatric: no depression/anxiety  Past Medical History  Diagnosis Date  . GERD (gastroesophageal reflux disease)   . Allergic rhinitis due to pollen   . Impaired fasting glucose   . Hyperlipidemia   . ED (erectile dysfunction)   . Type II or unspecified type diabetes mellitus without mention of complication, not stated as uncontrolled   . BPH (benign prostatic hypertrophy)    Past Surgical History  Procedure Laterality Date  . Wrist surgery  1970's  . Kidney stone surgery  ~1990   Social History   Social History  . Marital Status: Divorced    Spouse Name: N/A  . Number of Children: 3  . Years of Education: N/A   Occupational History  . [employer]   Social History Main Topics  . Smoking status: Never Smoker   . Smokeless tobacco: Never Used  . Alcohol Use: No  . Drug Use: No  . Sexual Activity: Not on file   Other Topics Concern  . Not on file   Social History Narrative   No living will.   Wife to be health care POA   Would want resuscitation attempts   Wouldn't want tube feeds if cognitively unaware   Current Outpatient Prescriptions on File Prior to Visit  Medication Sig Dispense Refill  . atorvastatin (LIPITOR) 40 MG tablet Take 1 tablet (40 mg total) by  mouth daily. 90 tablet 3  . BAYER MICROLET LANCETS lancets Use as instructed to test blood sugar once daily dx:E11.65 100 each 3  . canagliflozin (INVOKANA) 100 MG TABS tablet Take 1 tablet (100 mg total) by mouth daily. 30 tablet 2  . glipiZIDE (GLUCOTROL XL) 5 MG 24 hr tablet Take 1 tablet (5 mg total) by mouth daily with breakfast. 90 tablet 3  . glucose blood test strip Use as instructed to test blood sugar once daily dx: E11.65 100 each 3  . meclizine (ANTIVERT) 25 MG tablet Take 1 tablet (25 mg total) by mouth 3 (three) times daily as needed for  nausea (vertigo). 30 tablet 0  . metFORMIN (GLUCOPHAGE) 1000 MG tablet Take 1 tablet (1,000 mg total) by mouth 2 (two) times daily with a meal. 180 tablet 3  . sildenafil (REVATIO) 20 MG tablet Take 3-5 tablets (60-100 mg total) by mouth daily as needed. 50 tablet 11   No current facility-administered medications on file prior to visit.   No Known Allergies Family History  Problem Relation Age of Onset  . Cancer Mother   . Diabetes Daughter    PE: BP 118/70 mmHg  Pulse 90  Temp(Src) 97.5 F (36.4 C) (Oral)  Resp 12  Ht 5' 7.5" (1.715 m)  Wt 173 lb 9.6 oz (78.744 kg)  BMI 26.77 kg/m2  SpO2 94% Wt Readings from Last 3 Encounters:  07/15/15 173 lb 9.6 oz (78.744 kg)  04/16/15 175 lb 8 oz (79.606 kg)  03/20/15 174 lb 6.4 oz (79.107 kg)   Constitutional: normal weight, in NAD Eyes: PERRLA, EOMI, no exophthalmos ENT: moist mucous membranes, no thyromegaly, no cervical lymphadenopathy Cardiovascular: RRR, No MRG Respiratory: CTA B Gastrointestinal: abdomen soft, NT, ND, BS+ Musculoskeletal: no deformities, strength intact in all 4 Skin: moist, warm, no rashes Neurological: slight tremor with outstretched hands, DTR normal in all 4  ASSESSMENT: 1. DM2, non-insulin-dependent, uncontrolled, without complications  PLAN:  1. Patient with long-standing, uncontrolled diabetes, on oral antidiabetic regimen, now with greatly improved control after starting to see Dr Welford Roche. Since then, he is taking his meds as advised, changed his diet, and is very conscentious about checking sugars. I congratulated him about his discipline. No reason to change regimen today. -  I suggested to:  Patient Instructions  Please continue: - Metformin 1000 mg 2x a day, with meals - Invokana 100 mg in am - Glipizide XL 5 mg in am  Please come back for a follow-up appointment in 3 months with your sugar log.  - Strongly advised him to start checking sugars at different times of the day - check 2 times a  day, rotating checks - given sugar log and advised how to fill it and to bring it at next appt  - given foot care handout and explained the principles  - given instructions for hypoglycemia management "15-15 rule"  - advised for yearly eye exams >> he is UTD >> no DR - check HbA1c today >> 6.8% - Return to clinic in 3 mo with sugar log   - time spent with the patient: 40 min, of which >50% was spent in obtaining information about his DM2, reviewing his previous labs, evaluations, and treatments, counseling him about his condition (please see the discussed topics above), and developing a plan to further investigate it; he had a number of questions which I addressed.

## 2015-07-18 LAB — HM DIABETES EYE EXAM

## 2015-08-16 ENCOUNTER — Other Ambulatory Visit: Payer: Self-pay | Admitting: *Deleted

## 2015-08-16 DIAGNOSIS — E1165 Type 2 diabetes mellitus with hyperglycemia: Principal | ICD-10-CM

## 2015-08-16 DIAGNOSIS — IMO0001 Reserved for inherently not codable concepts without codable children: Secondary | ICD-10-CM

## 2015-08-16 MED ORDER — CANAGLIFLOZIN 100 MG PO TABS: 100.0000 mg | ORAL_TABLET | Freq: Every day | ORAL | Status: DC

## 2015-10-16 ENCOUNTER — Other Ambulatory Visit (INDEPENDENT_AMBULATORY_CARE_PROVIDER_SITE_OTHER): Payer: BLUE CROSS/BLUE SHIELD | Admitting: *Deleted

## 2015-10-16 ENCOUNTER — Ambulatory Visit (INDEPENDENT_AMBULATORY_CARE_PROVIDER_SITE_OTHER): Payer: BLUE CROSS/BLUE SHIELD | Admitting: Internal Medicine

## 2015-10-16 ENCOUNTER — Encounter: Payer: Self-pay | Admitting: Internal Medicine

## 2015-10-16 VITALS — BP 124/78 | HR 94 | Temp 97.8°F | Resp 12 | Wt 177.0 lb

## 2015-10-16 DIAGNOSIS — IMO0001 Reserved for inherently not codable concepts without codable children: Secondary | ICD-10-CM

## 2015-10-16 DIAGNOSIS — E1165 Type 2 diabetes mellitus with hyperglycemia: Secondary | ICD-10-CM | POA: Diagnosis not present

## 2015-10-16 DIAGNOSIS — B372 Candidiasis of skin and nail: Secondary | ICD-10-CM | POA: Diagnosis not present

## 2015-10-16 LAB — POCT GLYCOSYLATED HEMOGLOBIN (HGB A1C): HEMOGLOBIN A1C: 6.9

## 2015-10-16 MED ORDER — FLUCONAZOLE 150 MG PO TABS: 150.0000 mg | ORAL_TABLET | Freq: Once | ORAL | Status: DC

## 2015-10-16 NOTE — Patient Instructions (Signed)
Patient Instructions  Please continue: - Metformin 1000 mg 2x a day, with meals - Invokana 100 mg in am - Glipizide XL 5 mg in am  I sent a prescription of Diflucan to your pharmacy.  Please come back for a follow-up appointment in 3 months with your sugar log.

## 2015-10-16 NOTE — Progress Notes (Signed)
Patient ID: Douglas [name], male   DOB: [birth year], 69 y.o.   MRN: [id number]  HPI: Douglas [name] is a 69 y.o.-year-old male, returning for f/u for DM2, dx in 2010, non-insulin-dependent, uncontrolled, with complications (ED). He previously saw Dr Welford Roche, first visit with me 3 mo ago.  Last hemoglobin A1c was: Lab Results  Component Value Date   HGBA1C 6.8 07/15/2015   HGBA1C 6.9* 04/16/2015   HGBA1C 11.6* 12/11/2014   Pt is on a regimen of: - Metformin 1000 mg 2x a day, with meals - Invokana 100 mg in am - Glipizide XL 5 mg in am  Pt checks his sugars 1x a day and they are: - am: 92-130, 183, 189 >> 87, 89, 110-161, 175-195 over Thanksgiving - 2h after b'fast: n/c - before lunch: n/c - 2h after lunch: n/c - before dinner: n/c - 2h after dinner: n/c - bedtime: n/c - nighttime: n/c No lows. Lowest sugar was 89 >> 87; ? has hypoglycemia awareness. Highest sugar was 189 >> 205.  Glucometer: [employer]  [employer] are (usually cooks his own meals): - Breakfast: scrambled eggs + sausage/bologna + coffee - Lunch: hot dog; burger - Dinner: salad + baked chicken - Snacks: fruit: bananas, apples, grapes  - no CKD, last BUN/creatinine:  Lab Results  Component Value Date   BUN 15 04/16/2015   CREATININE 0.94 04/16/2015   - last set of lipids: Lab Results  Component Value Date   CHOL 138 04/16/2015   HDL 50.00 04/16/2015   LDLCALC 72 04/16/2015   LDLDIRECT 188.3 07/27/2013   TRIG 81.0 04/16/2015   CHOLHDL 3 04/16/2015  On Lipitor. - last eye exam was in 2016. No DR. + cataract - low grade. - no numbness and tingling in his feet. Last foot exam normal 12/2014.  Pt has FH of DM in daughter (? Type).  ROS: Constitutional: no weight gain/loss, no fatigue, no subjective hyperthermia/hypothermia Eyes: no blurry vision, no xerophthalmia ENT: no sore throat, no nodules palpated in throat, no dysphagia/odynophagia, no hoarseness Cardiovascular: no  CP/SOB/palpitations/leg swelling Respiratory: no cough/SOB Gastrointestinal: no N/V/D/C Musculoskeletal: no muscle/joint aches Skin: no rashes Neurological: no tremors/numbness/tingling/dizziness  I reviewed pt's medications, allergies, PMH, social hx, family hx, and changes were documented in the history of present illness. Otherwise, unchanged from my initial visit note.  Past Medical History  Diagnosis Date  . GERD (gastroesophageal reflux disease)   . Allergic rhinitis due to pollen   . Impaired fasting glucose   . Hyperlipidemia   . ED (erectile dysfunction)   . Type II or unspecified type diabetes mellitus without mention of complication, not stated as uncontrolled   . BPH (benign prostatic hypertrophy)    Past Surgical History  Procedure Laterality Date  . Wrist surgery  1970's  . Kidney stone surgery  ~1990   Social History   Social History  . Marital Status: Divorced    Spouse Name: N/A  . Number of Children: 3  . Years of Education: N/A   Occupational History  . [employer]   Social History Main Topics  . Smoking status: Never Smoker   . Smokeless tobacco: Never Used  . Alcohol Use: No  . Drug Use: No  . Sexual Activity: Not on file   Other Topics Concern  . Not on file   Social History Narrative   No living will.   Wife to be health care POA   Would want resuscitation attempts   Wouldn't want  tube feeds if cognitively unaware   Current Outpatient Prescriptions on File Prior to Visit  Medication Sig Dispense Refill  . atorvastatin (LIPITOR) 40 MG tablet Take 1 tablet (40 mg total) by mouth daily. 90 tablet 3  . BAYER MICROLET LANCETS lancets Use as instructed to test blood sugar once daily dx:E11.65 100 each 3  . canagliflozin (INVOKANA) 100 MG TABS tablet Take 1 tablet (100 mg total) by mouth daily. 30 tablet 2  . glipiZIDE (GLUCOTROL XL) 5 MG 24 hr tablet Take 1 tablet (5 mg total) by mouth daily with breakfast. 90 tablet 3  . glucose  blood test strip Use as instructed to test blood sugar once daily dx: E11.65 100 each 3  . meclizine (ANTIVERT) 25 MG tablet Take 1 tablet (25 mg total) by mouth 3 (three) times daily as needed for nausea (vertigo). 30 tablet 0  . metFORMIN (GLUCOPHAGE) 1000 MG tablet Take 1 tablet (1,000 mg total) by mouth 2 (two) times daily with a meal. 180 tablet 3  . sildenafil (REVATIO) 20 MG tablet Take 3-5 tablets (60-100 mg total) by mouth daily as needed. 50 tablet 11   No current facility-administered medications on file prior to visit.   No Known Allergies Family History  Problem Relation Age of Onset  . Cancer Mother   . Diabetes Daughter    PE: BP 124/78 mmHg  Pulse 94  Temp(Src) 97.8 F (36.6 C) (Oral)  Resp 12  Wt 177 lb (80.287 kg)  SpO2 95% Body mass index is 27.3 kg/(m^2). Wt Readings from Last 3 Encounters:  10/16/15 177 lb (80.287 kg)  07/15/15 173 lb 9.6 oz (78.744 kg)  04/16/15 175 lb 8 oz (79.606 kg)   Constitutional: normal weight, in NAD Eyes: PERRLA, EOMI, no exophthalmos ENT: moist mucous membranes, no thyromegaly, no cervical lymphadenopathy Cardiovascular: RRR, No MRG Respiratory: CTA B Gastrointestinal: abdomen soft, NT, ND, BS+ Musculoskeletal: no deformities, strength intact in all 4 Skin: moist, warm, no rashes Neurological: slight tremor with outstretched hands, DTR normal in all 4  ASSESSMENT: 1. DM2, non-insulin-dependent, uncontrolled, without complications  2. Yeast inf  PLAN:  1. Patient with long-standing, uncontrolled diabetes, on oral antidiabetic regimen, now with greatly improved control in last months as he is taking his meds as advised, changed his diet, and is very conscentious about checking sugars.No change in regimen today. -  I suggested to:  Patient Instructions  Please continue: - Metformin 1000 mg 2x a day, with meals - Invokana 100 mg in am - Glipizide XL 5 mg in am  Please come back for a follow-up appointment in 3 months with  your sugar log.  - continue checking sugars at different times of the day - check once a day, rotating checks - given new sugar logs - advised for yearly eye exams >> he is UTD >> no DR - UTD with flu and PNA vaccines - check HbA1c today >> 6.9% (same) - Return to clinic in 3 mo with sugar log   2. Yeast inf - Rx Diflucan. If this persists, may need to stop Invokana.

## 2015-11-26 ENCOUNTER — Other Ambulatory Visit: Payer: Self-pay | Admitting: Internal Medicine

## 2015-11-26 MED FILL — FLUCONAZOLE 150 MG TABLET: 150 | 1 days supply | Qty: 1 | Fill #1

## 2015-11-28 ENCOUNTER — Other Ambulatory Visit: Payer: Self-pay | Admitting: Internal Medicine

## 2015-11-28 NOTE — Telephone Encounter (Signed)
Refill denied it's been almost a year since pt was seen by Dr. Alphonsus SiasLetvak

## 2015-12-02 ENCOUNTER — Other Ambulatory Visit: Payer: Self-pay | Admitting: Internal Medicine

## 2015-12-02 MED FILL — INVOKANA 100 MG TABLET: 100 | 30 days supply | Qty: 30 | Fill #0

## 2015-12-10 MED FILL — metFORMIN HCL 1000 MG TABS: 1000 | 90 days supply | Qty: 180 | Fill #3

## 2016-01-04 IMAGING — CR DG KNEE 1-2V*L*
3 series · 3 of 3 positions shown · non-contrast
Comparison: None.

CLINICAL DATA: Left knee pain.

EXAM:
LEFT KNEE - 1-2 VIEW

[view not recorded (1 of 3)]
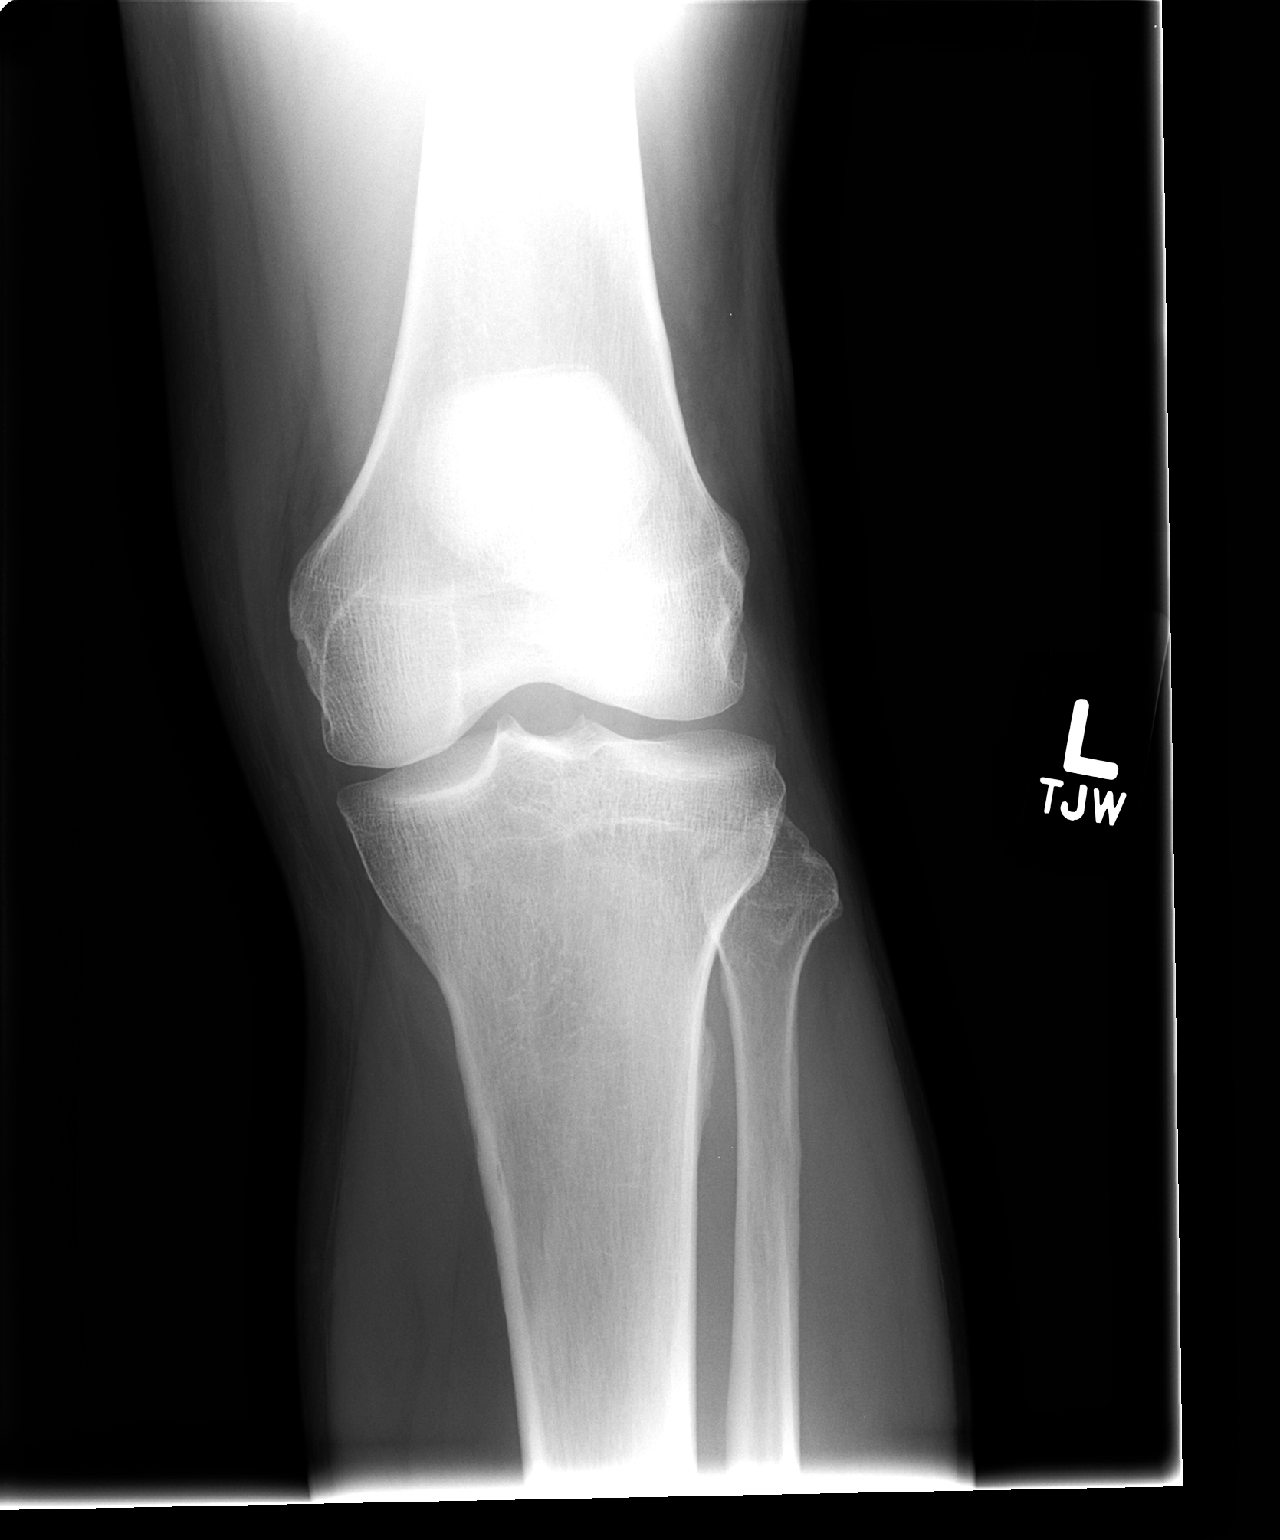

[view not recorded (2 of 3)]
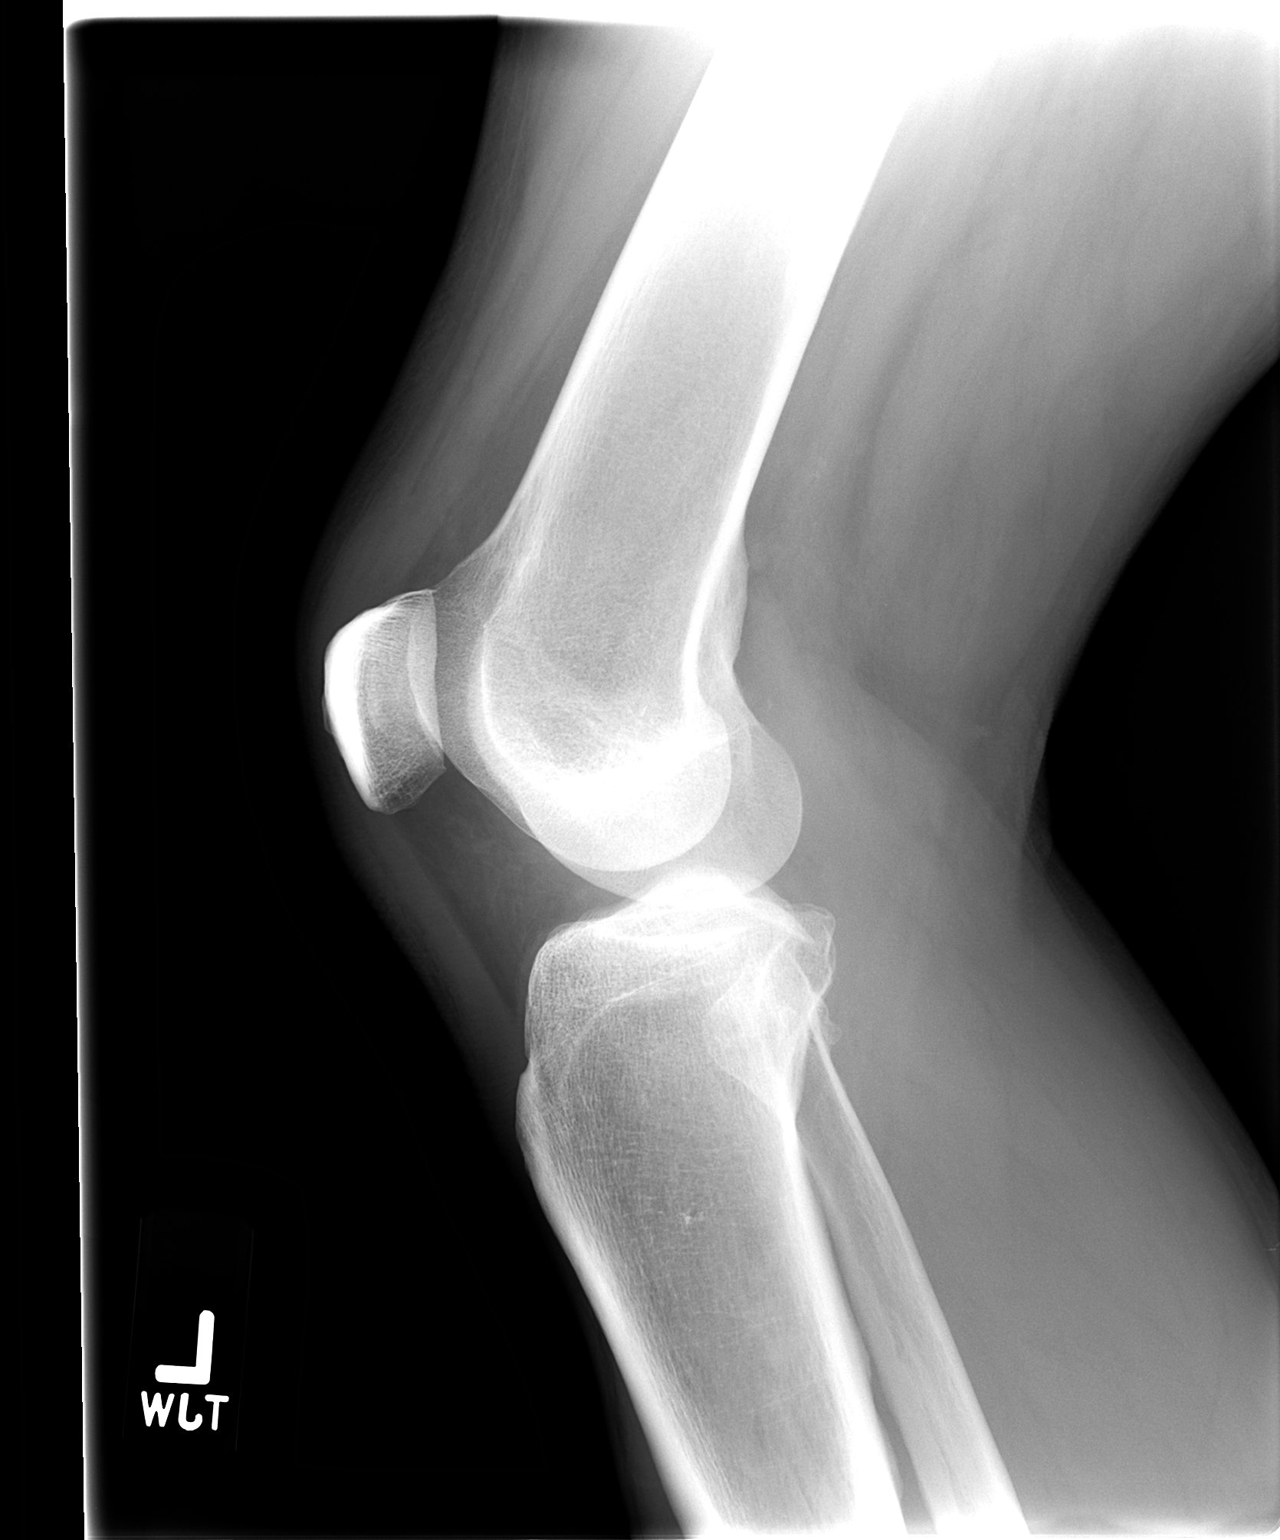

[view not recorded (3 of 3)]
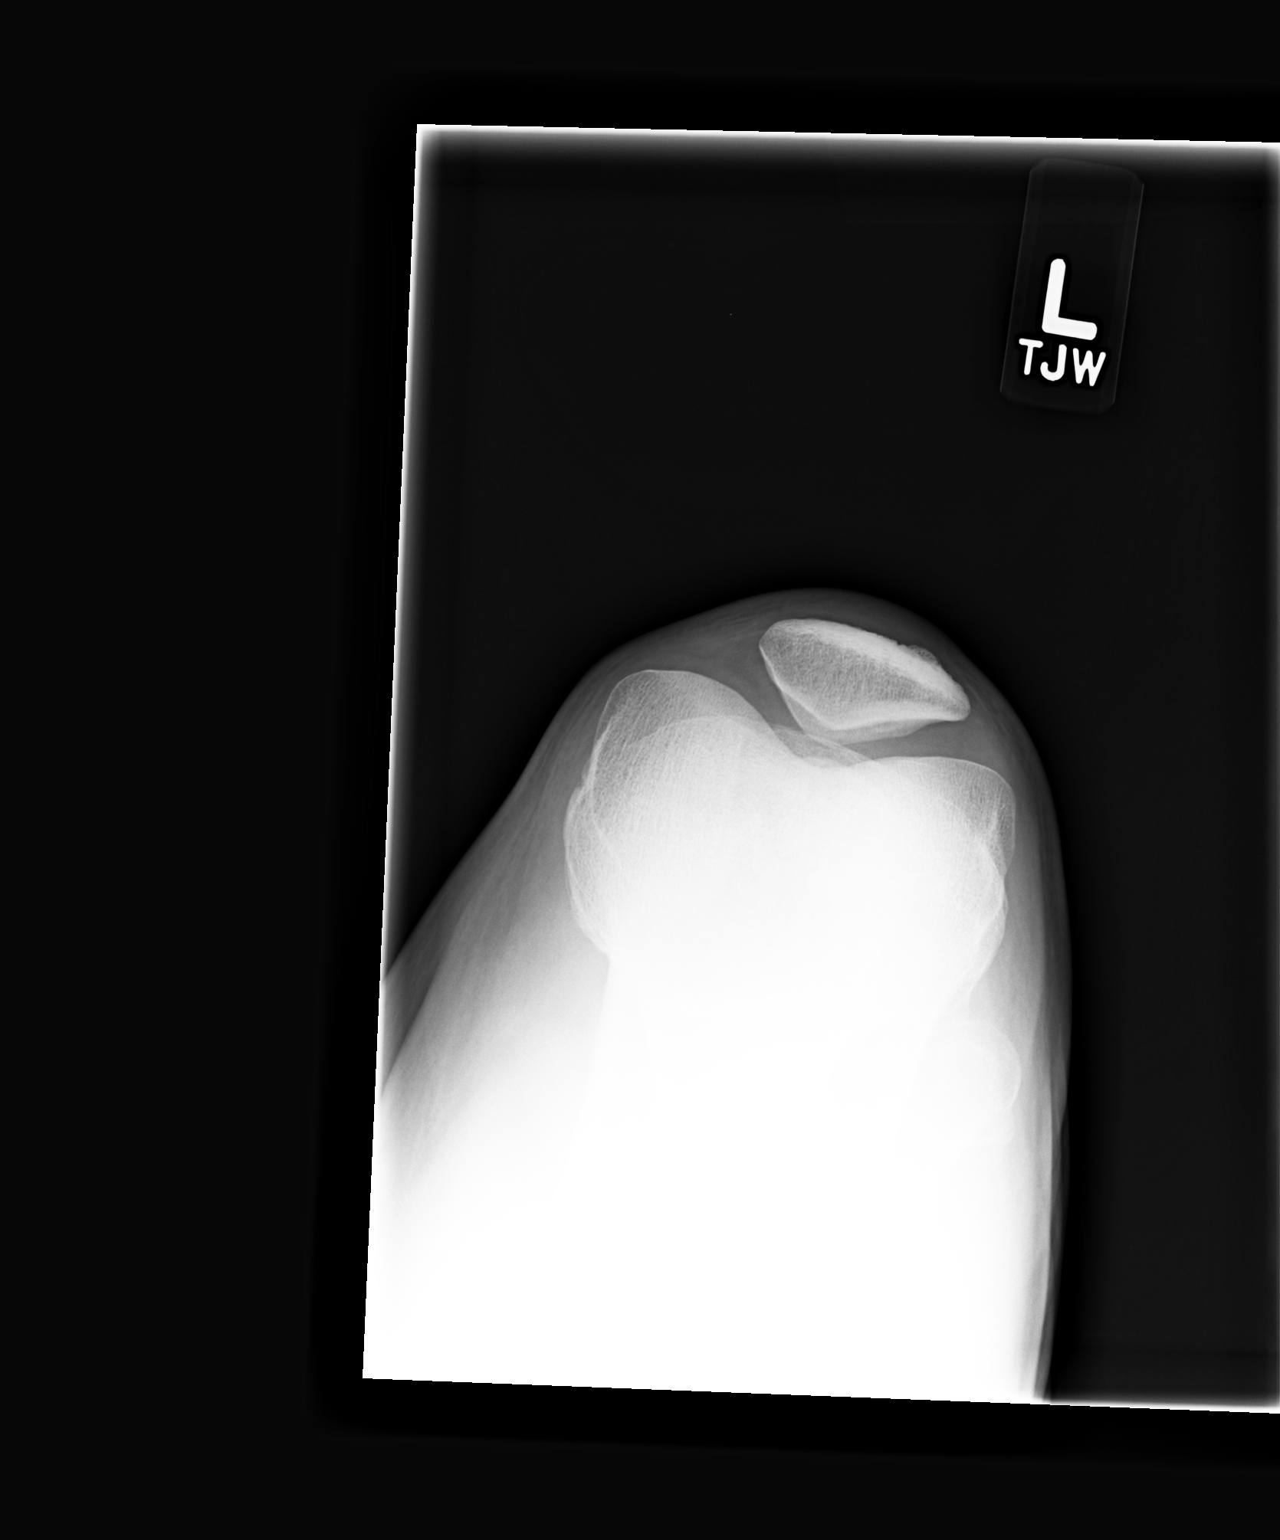

[3 of 3 positions shown; findings below may reference images not displayed]

FINDINGS: The left knee joint spaces appear normal for age. No fracture is
seen. No significant degenerative change is noted. There is no
evidence of joint space effusion.
IMPRESSION: Negative.

## 2016-01-09 ENCOUNTER — Other Ambulatory Visit: Payer: Self-pay | Admitting: *Deleted

## 2016-01-09 NOTE — Telephone Encounter (Signed)
Approved: Yes--fill for 2 months till she decides

## 2016-01-09 NOTE — Telephone Encounter (Signed)
Ok to fill? Pt have appt 01/15/2016 with Dr. Lafe Garin

## 2016-01-10 MED ORDER — CANAGLIFLOZIN 100 MG PO TABS: 100.0000 mg | ORAL_TABLET | Freq: Every day | ORAL | Status: DC

## 2016-01-10 MED FILL — INVOKANA 100 MG TABLET: 100 | 30 days supply | Qty: 30 | Fill #0

## 2016-01-10 NOTE — Telephone Encounter (Signed)
rx sent to pharmacy by e-script  

## 2016-01-15 ENCOUNTER — Other Ambulatory Visit (INDEPENDENT_AMBULATORY_CARE_PROVIDER_SITE_OTHER): Payer: BLUE CROSS/BLUE SHIELD | Admitting: *Deleted

## 2016-01-15 ENCOUNTER — Ambulatory Visit (INDEPENDENT_AMBULATORY_CARE_PROVIDER_SITE_OTHER): Payer: BLUE CROSS/BLUE SHIELD | Admitting: Internal Medicine

## 2016-01-15 ENCOUNTER — Encounter: Payer: Self-pay | Admitting: Internal Medicine

## 2016-01-15 VITALS — BP 128/80 | HR 91 | Temp 97.8°F | Resp 12 | Wt 176.0 lb

## 2016-01-15 DIAGNOSIS — N521 Erectile dysfunction due to diseases classified elsewhere: Secondary | ICD-10-CM

## 2016-01-15 DIAGNOSIS — E785 Hyperlipidemia, unspecified: Secondary | ICD-10-CM | POA: Diagnosis not present

## 2016-01-15 DIAGNOSIS — E1159 Type 2 diabetes mellitus with other circulatory complications: Secondary | ICD-10-CM | POA: Diagnosis not present

## 2016-01-15 LAB — POCT GLYCOSYLATED HEMOGLOBIN (HGB A1C): HEMOGLOBIN A1C: 6.9

## 2016-01-15 NOTE — Patient Instructions (Addendum)
Patient Instructions  Please continue: - Metformin 1000 mg 2x a day, with meals - Invokana 100 mg in am - Glipizide XL 5 mg in am  Try to take the Lipitor 1 week apart and move the doses closer as tolerated.  Please come back for a follow-up appointment in 3 months with your sugar log.

## 2016-01-15 NOTE — Progress Notes (Signed)
Patient ID: Douglas [name], male   DOB: 22-Mar-1946, 70 y.o.   MRN: [id number]  HPI: Douglas [name] is a 70 y.o.-year-old male, returning for f/u for DM2, dx in 2010, non-insulin-dependent, uncontrolled, with complications (ED). Last visit 69 mo ago.  Last hemoglobin A1c was: Lab Results  Component Value Date   HGBA1C 6.9 10/16/2015   HGBA1C 6.8 07/15/2015   HGBA1C 6.9* 04/16/2015   Pt is on a regimen of: - Metformin 1000 mg 2x a day, with meals - Invokana 100 mg in am (was out for 5 days, restarted yesterday) - Glipizide XL 5 mg in am  Pt checks his sugars 1x a day and they are: - am: 92-130, 183, 189 >> 87, 89, 110-161, 175-195 over Thanksgiving >> 92, 106-166, 179, 190 - 2h after b'fast: n/c - before lunch: n/c - 2h after lunch: n/c - before dinner: n/c - 2h after dinner: n/c - bedtime: n/c - nighttime: n/c No lows. Lowest sugar was 89 >> 87 >> 92; ? has hypoglycemia awareness. Highest sugar was 189 >> 205 >> 190.  Glucometer: [employer]  [employer] are (usually cooks his own meals): - Breakfast: scrambled eggs + sausage/bologna + coffee - Lunch: hot dog; burger - Dinner: salad + baked chicken - Snacks: fruit: bananas, apples, grapes  - no CKD, last BUN/creatinine:  Lab Results  Component Value Date   BUN 15 04/16/2015   CREATININE 0.94 04/16/2015   - last set of lipids: Lab Results  Component Value Date   CHOL 138 04/16/2015   HDL 50.00 04/16/2015   LDLCALC 72 04/16/2015   LDLDIRECT 188.3 07/27/2013   TRIG 81.0 04/16/2015   CHOLHDL 3 04/16/2015  On Lipitor >> but stopped 1 week ago b/c AP. - last eye exam was in 2016. No DR. + cataract - low grade. - no numbness and tingling in his feet. Last foot exam normal 12/2014.  ROS: Constitutional: no weight gain/loss, no fatigue, no subjective hyperthermia/hypothermia Eyes: no blurry vision, no xerophthalmia ENT: no sore throat, no nodules palpated in throat, no dysphagia/odynophagia, no  hoarseness Cardiovascular: no CP/SOB/palpitations/leg swelling Respiratory: no cough/SOB Gastrointestinal: no N/V/D/C Musculoskeletal: no muscle/joint aches Skin: no rashes Neurological: no tremors/numbness/tingling/dizziness  I reviewed pt's medications, allergies, PMH, social hx, family hx, and changes were documented in the history of present illness. Otherwise, unchanged from my initial visit note.  Past Medical History  Diagnosis Date  . GERD (gastroesophageal reflux disease)   . Allergic rhinitis due to pollen   . Impaired fasting glucose   . Hyperlipidemia   . ED (erectile dysfunction)   . Type II or unspecified type diabetes mellitus without mention of complication, not stated as uncontrolled   . BPH (benign prostatic hypertrophy)    Past Surgical History  Procedure Laterality Date  . Wrist surgery  1970's  . Kidney stone surgery  ~1990   Social History   Social History  . Marital Status: Divorced    Spouse Name: N/A  . Number of Children: 3  . Years of Education: N/A   Occupational History  . [employer]   Social History Main Topics  . Smoking status: Never Smoker   . Smokeless tobacco: Never Used  . Alcohol Use: No  . Drug Use: No  . Sexual Activity: Not on file   Other Topics Concern  . Not on file   Social History Narrative   No living will.   Wife to be health care POA   Would  want resuscitation attempts   Wouldn't want tube feeds if cognitively unaware   Current Outpatient Prescriptions on File Prior to Visit  Medication Sig Dispense Refill  . atorvastatin (LIPITOR) 40 MG tablet Take 1 tablet (40 mg total) by mouth daily. 90 tablet 3  . BAYER MICROLET LANCETS lancets Use as instructed to test blood sugar once daily dx:E11.65 100 each 3  . canagliflozin (INVOKANA) 100 MG TABS tablet Take 1 tablet (100 mg total) by mouth daily. 30 tablet 0  . glipiZIDE (GLUCOTROL XL) 5 MG 24 hr tablet Take 1 tablet (5 mg total) by mouth daily with  breakfast. 90 tablet 3  . glucose blood test strip Use as instructed to test blood sugar once daily dx: E11.65 100 each 3  . meclizine (ANTIVERT) 25 MG tablet Take 1 tablet (25 mg total) by mouth 3 (three) times daily as needed for nausea (vertigo). 30 tablet 0  . metFORMIN (GLUCOPHAGE) 1000 MG tablet Take 1 tablet (1,000 mg total) by mouth 2 (two) times daily with a meal. 180 tablet 3  . sildenafil (REVATIO) 20 MG tablet Take 3-5 tablets (60-100 mg total) by mouth daily as needed. 50 tablet 11   No current facility-administered medications on file prior to visit.   No Known Allergies Family History  Problem Relation Age of Onset  . Cancer Mother   . Diabetes Daughter    PE: BP 128/80 mmHg  Pulse 91  Temp(Src) 97.8 F (36.6 C) (Oral)  Resp 12  Wt 176 lb (79.833 kg)  SpO2 95% Body mass index is 27.14 kg/(m^2). Wt Readings from Last 3 Encounters:  01/15/16 176 lb (79.833 kg)  10/16/15 177 lb (80.287 kg)  07/15/15 173 lb 9.6 oz (78.744 kg)   Constitutional: normal weight, in NAD Eyes: PERRLA, EOMI, no exophthalmos ENT: moist mucous membranes, no thyromegaly, no cervical lymphadenopathy Cardiovascular: RRR, No MRG Respiratory: CTA B Gastrointestinal: abdomen soft, NT, ND, BS+ Musculoskeletal: no deformities, strength intact in all 4 Skin: moist, warm, no rashes Neurological: slight tremor with outstretched hands, DTR normal in all 4  ASSESSMENT: 1. DM2, non-insulin-dependent, uncontrolled, without complications  2. HL  PLAN:  1. Patient with long-standing, uncontrolled diabetes, on oral antidiabetic regimen, with stable control, however, he is only checking sugars in am >> advised to start checking later in the day, also. No change in regimen today. -  I suggested to:  Patient Instructions  Please continue: - Metformin 1000 mg 2x a day, with meals - Invokana 100 mg in am - Glipizide XL 5 mg in am  Please come back for a follow-up appointment in 3 months with your sugar  log.  - continue checking sugars at different times of the day - check once a day, rotating checks - given new sugar logs - advised for yearly eye exams >> he is UTD >> no DR - UTD with flu and PNA vaccines - check HbA1c today >> 6.9% (same) - Return to clinic in 3 mo with sugar log   2. HL - AP with Lipitor >> advised to take it once a week.

## 2016-02-06 ENCOUNTER — Other Ambulatory Visit: Payer: Self-pay | Admitting: Internal Medicine

## 2016-02-06 MED FILL — glipiZIDE ER 5 MG TB24: 5 | 90 days supply | Qty: 90 | Fill #0

## 2016-02-13 ENCOUNTER — Ambulatory Visit (INDEPENDENT_AMBULATORY_CARE_PROVIDER_SITE_OTHER): Payer: BLUE CROSS/BLUE SHIELD | Admitting: Internal Medicine

## 2016-02-13 ENCOUNTER — Encounter: Payer: Self-pay | Admitting: Internal Medicine

## 2016-02-13 VITALS — BP 118/90 | HR 98 | Temp 97.6°F | Ht 68.0 in | Wt 176.0 lb

## 2016-02-13 DIAGNOSIS — N521 Erectile dysfunction due to diseases classified elsewhere: Secondary | ICD-10-CM

## 2016-02-13 DIAGNOSIS — E1159 Type 2 diabetes mellitus with other circulatory complications: Secondary | ICD-10-CM

## 2016-02-13 DIAGNOSIS — Z23 Encounter for immunization: Secondary | ICD-10-CM

## 2016-02-13 DIAGNOSIS — Z0000 Encounter for general adult medical examination without abnormal findings: Secondary | ICD-10-CM | POA: Diagnosis not present

## 2016-02-13 DIAGNOSIS — Z1211 Encounter for screening for malignant neoplasm of colon: Secondary | ICD-10-CM

## 2016-02-13 DIAGNOSIS — N400 Enlarged prostate without lower urinary tract symptoms: Secondary | ICD-10-CM | POA: Diagnosis not present

## 2016-02-13 DIAGNOSIS — E785 Hyperlipidemia, unspecified: Secondary | ICD-10-CM | POA: Diagnosis not present

## 2016-02-13 LAB — HM DIABETES FOOT EXAM

## 2016-02-13 MED ORDER — FLUCONAZOLE 150 MG PO TABS: 150.0000 mg | ORAL_TABLET | Freq: Once | ORAL | Status: DC

## 2016-02-13 MED FILL — FLUCONAZOLE 150 MG TABLET: 150 | 2 days supply | Qty: 2 | Fill #0

## 2016-02-13 NOTE — Progress Notes (Signed)
Subjective:    Patient ID: Douglas [name], male    DOB: 08/13/1946, 70 y.o.   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 atorvastatin (LIPITOR) 40 MG tablet Take 1 tablet (40 mg total) by mouth daily. 90 tablet 3  . BAYER MICROLET LANCETS lancets Use as instructed to test blood sugar once daily dx:E11.65 100 each 3  . canagliflozin (INVOKANA) 100 MG TABS tablet Take 1 tablet (100 mg total) by mouth daily. 30 tablet 0  . glipiZIDE (GLUCOTROL XL) 5 MG 24 hr tablet TAKE 1 TABLET BY MOUTH DAILY WITH BREAKFAST. 90 tablet 3  . glucose blood test strip Use as instructed to test blood sugar once daily dx: E11.65 100 each 3  . meclizine (ANTIVERT) 25 MG tablet Take 1 tablet (25 mg total) by mouth 3 (three) times daily as needed for nausea (vertigo). 30 tablet 0  . metFORMIN (GLUCOPHAGE) 1000 MG tablet Take 1 tablet (1,000 mg total) by mouth 2 (two) times daily with a meal. 180 tablet 3  . sildenafil (REVATIO) 20 MG tablet Take 3-5 tablets (60-100 mg total) by mouth daily as needed. 50 tablet 11   No current facility-administered medications on file prior to visit.    No Known Allergies  Past Medical History  Diagnosis Date  . GERD (gastroesophageal reflux disease)   . Allergic rhinitis due to pollen   . Impaired fasting glucose   . Hyperlipidemia   . ED (erectile dysfunction)   . Type II or unspecified type diabetes mellitus without mention of complication, not stated as uncontrolled   . BPH (benign prostatic hypertrophy)     Past Surgical History  Procedure Laterality Date  . Wrist  surgery  1970's  . Kidney stone surgery  ~1990    Family History  Problem Relation Age of Onset  . Cancer Mother   . Diabetes Daughter     Social History   Social History  . Marital Status: Divorced    Spouse Name: N/A  . Number of Children: 3  . Years of Education: N/A   Occupational History  . [employer]   Social History Main Topics  . Smoking status: Never Smoker   . Smokeless tobacco: Never Used  . Alcohol Use: No  . Drug Use: No  . Sexual Activity: Not on file   Other Topics Concern  . Not on file   Social History Narrative   No living will.   Wife to be health care POA   Would want resuscitation attempts   Wouldn't want tube feeds if cognitively unaware   Review of Systems  Constitutional: Negative for fatigue and unexpected weight change.       Wears seat belt  HENT: Positive for dental problem. Negative for hearing loss, tinnitus and trouble swallowing.        Cracked tooth--overdue for dentist  Eyes: Negative for redness and visual disturbance.  Respiratory: Positive for cough. Negative for chest tightness and shortness of breath.        Some cough from allergies  Cardiovascular: Negative for chest pain  and palpitations.  Gastrointestinal: Negative for nausea, vomiting, abdominal pain, constipation and blood in stool.       Rare heartburn--- prilosec prn   Endocrine: Positive for polyuria. Negative for polydipsia.       Invokana effect  Genitourinary: Positive for frequency and difficulty urinating.       Takes OTC med which helps stream (?saw palmetto)--2-3 per week only Hasn't needed sildenafil lately  Musculoskeletal: Positive for arthralgias. Negative for joint swelling.       Back stiffness with prolonged sitting---eases up quickly.   Skin:       Itchy spot on upper back  Allergic/Immunologic: Positive for environmental allergies. Negative for immunocompromised state.  Neurological: Negative for dizziness, syncope, weakness,  light-headedness and headaches.       Stable numbness in tips of left 1-3rd fingers (since well before diabetes)  Hematological: Negative for adenopathy. Does not bruise/bleed easily.  Psychiatric/Behavioral: Negative for sleep disturbance and dysphoric mood. The patient is not nervous/anxious.        Objective:   Physical Exam  Constitutional: He is oriented to person, place, and time. He appears well-developed and well-nourished. No distress.  HENT:  Head: Normocephalic and atraumatic.  Right Ear: External ear normal.  Left Ear: External ear normal.  Mouth/Throat: Oropharynx is clear and moist. No oropharyngeal exudate.  Eyes: Conjunctivae and EOM are normal. Pupils are equal, round, and reactive to light.  Neck: Normal range of motion. Neck supple. No thyromegaly present.  Cardiovascular: Normal rate, regular rhythm, normal heart sounds and intact distal pulses.  Exam reveals no gallop.   No murmur heard. Pulmonary/Chest: Effort normal and breath sounds normal. No respiratory distress. He has no wheezes. He has no rales.  Abdominal: Soft. There is no tenderness.  Musculoskeletal: He exhibits no edema or tenderness.  Lymphadenopathy:    He has no cervical adenopathy.  Neurological: He is alert and oriented to person, place, and time.  Normal sensation in feet  Skin: No rash noted. No erythema.  No foot lesions  Psychiatric: He has a normal mood and affect. His behavior is normal.          Assessment & Plan:

## 2016-02-13 NOTE — Progress Notes (Signed)
Pre visit review using our clinic review tool, if applicable. No additional management support is needed unless otherwise documented below in the visit note. 

## 2016-02-13 NOTE — Assessment & Plan Note (Signed)
Some symptoms with statin Tries to take regularly though

## 2016-02-13 NOTE — Addendum Note (Signed)
Addended by: Eual FinesBRIDGES, SHANNON P on: 02/13/2016 03:20 PM   Modules accepted: Orders

## 2016-02-13 NOTE — Assessment & Plan Note (Signed)
Mild Uses OTC med with success

## 2016-02-13 NOTE — Assessment & Plan Note (Signed)
Excellent control now Fluconazole for prn use with genital yeast due to invokana

## 2016-02-13 NOTE — Assessment & Plan Note (Signed)
Healthy in general Discussed fitness Had prevnar this fall PSA after discussion FIT

## 2016-02-14 LAB — CBC WITH DIFFERENTIAL/PLATELET
BASOS ABS: 0.1 10*3/uL (ref 0.0–0.1)
Basophils Relative: 0.8 % (ref 0.0–3.0)
EOS ABS: 0.3 10*3/uL (ref 0.0–0.7)
Eosinophils Relative: 4.5 % (ref 0.0–5.0)
HCT: 45.5 % (ref 39.0–52.0)
Hemoglobin: 15.1 g/dL (ref 13.0–17.0)
LYMPHS ABS: 3.4 10*3/uL (ref 0.7–4.0)
LYMPHS PCT: 45.3 % (ref 12.0–46.0)
MCHC: 33.2 g/dL (ref 30.0–36.0)
MCV: 87.1 fl (ref 78.0–100.0)
MONO ABS: 0.7 10*3/uL (ref 0.1–1.0)
Monocytes Relative: 8.7 % (ref 3.0–12.0)
NEUTROS ABS: 3 10*3/uL (ref 1.4–7.7)
NEUTROS PCT: 40.7 % — AB (ref 43.0–77.0)
PLATELETS: 229 10*3/uL (ref 150.0–400.0)
RBC: 5.23 Mil/uL (ref 4.22–5.81)
RDW: 15.3 % (ref 11.5–15.5)
WBC: 7.4 10*3/uL (ref 4.0–10.5)

## 2016-02-14 LAB — COMPREHENSIVE METABOLIC PANEL
ALT: 25 U/L (ref 0–53)
AST: 15 U/L (ref 0–37)
Albumin: 4.6 g/dL (ref 3.5–5.2)
Alkaline Phosphatase: 60 U/L (ref 39–117)
BILIRUBIN TOTAL: 0.3 mg/dL (ref 0.2–1.2)
BUN: 17 mg/dL (ref 6–23)
CALCIUM: 10 mg/dL (ref 8.4–10.5)
CO2: 28 meq/L (ref 19–32)
CREATININE: 1.06 mg/dL (ref 0.40–1.50)
Chloride: 103 mEq/L (ref 96–112)
GFR: 89.25 mL/min (ref >60.00)
Glucose, Bld: 147 mg/dL — ABNORMAL HIGH (ref 70–99)
Potassium: 4 mEq/L (ref 3.5–5.1)
Sodium: 138 mEq/L (ref 135–145)
Total Protein: 7.5 g/dL (ref 6.0–8.3)

## 2016-02-14 LAB — LDL CHOLESTEROL, DIRECT: LDL DIRECT: 173 mg/dL

## 2016-02-14 LAB — PSA: PSA: 0.68 ng/mL (ref 0.10–4.00)

## 2016-02-14 LAB — HEPATITIS C ANTIBODY: HCV Ab: NEGATIVE

## 2016-02-14 LAB — LIPID PANEL
CHOLESTEROL: 268 mg/dL — AB (ref 0–200)
HDL: 46.9 mg/dL (ref >39.00)
NonHDL: 220.84
TRIGLYCERIDES: 209 mg/dL — AB (ref 0.0–149.0)
Total CHOL/HDL Ratio: 6
VLDL: 41.8 mg/dL — ABNORMAL HIGH (ref 0.0–40.0)

## 2016-02-14 MED FILL — FLUCONAZOLE 150 MG TABLET: 150 | 2 days supply | Qty: 2 | Fill #1

## 2016-02-24 ENCOUNTER — Telehealth: Payer: Self-pay

## 2016-02-24 NOTE — Telephone Encounter (Signed)
Okay Have him start every other day and I will just recheck at his next appt with me

## 2016-02-24 NOTE — Telephone Encounter (Signed)
-----   Message from Karie Schwalbeichard I Letvak, MD sent at 02/14/2016  2:01 PM EDT ----- Please call him His labs look fine except the cholesterol level is up quite a bit. Make sure he is taking the atorvastatin (and if not, find out why) Everything else was fine Send copy if he wishes

## 2016-02-24 NOTE — Telephone Encounter (Signed)
Patient says he was not taking the atorvastatin before he had his labs done because he said it was making him nausous. But, since then, he saw his endocrinologist who suggested he take it every other day. He said he is willing to do that.

## 2016-02-24 NOTE — Telephone Encounter (Signed)
Spoke to patient. Corrected med list

## 2016-02-26 ENCOUNTER — Other Ambulatory Visit: Payer: Self-pay | Admitting: Internal Medicine

## 2016-02-26 MED FILL — INVOKANA 100 MG TABLET: 100 | 30 days supply | Qty: 30 | Fill #0

## 2016-03-10 ENCOUNTER — Other Ambulatory Visit (INDEPENDENT_AMBULATORY_CARE_PROVIDER_SITE_OTHER): Payer: BLUE CROSS/BLUE SHIELD

## 2016-03-10 DIAGNOSIS — Z1211 Encounter for screening for malignant neoplasm of colon: Secondary | ICD-10-CM | POA: Diagnosis not present

## 2016-03-11 LAB — FECAL OCCULT BLOOD, IMMUNOCHEMICAL: FECAL OCCULT BLD: NEGATIVE

## 2016-04-07 MED FILL — INVOKANA 100 MG TABLET: 100 | 30 days supply | Qty: 30 | Fill #1

## 2016-04-07 MED FILL — FLUCONAZOLE 150 MG TABLET: 150 | 2 days supply | Qty: 2 | Fill #2

## 2016-04-16 ENCOUNTER — Ambulatory Visit: Payer: BLUE CROSS/BLUE SHIELD | Admitting: Internal Medicine

## 2016-04-17 ENCOUNTER — Encounter: Payer: BLUE CROSS/BLUE SHIELD | Admitting: Internal Medicine

## 2016-04-20 ENCOUNTER — Ambulatory Visit (INDEPENDENT_AMBULATORY_CARE_PROVIDER_SITE_OTHER): Payer: BLUE CROSS/BLUE SHIELD | Admitting: Internal Medicine

## 2016-04-20 ENCOUNTER — Other Ambulatory Visit (INDEPENDENT_AMBULATORY_CARE_PROVIDER_SITE_OTHER): Payer: BLUE CROSS/BLUE SHIELD | Admitting: *Deleted

## 2016-04-20 ENCOUNTER — Encounter: Payer: Self-pay | Admitting: Internal Medicine

## 2016-04-20 VITALS — BP 118/80 | HR 99 | Temp 97.5°F | Resp 12 | Wt 177.0 lb

## 2016-04-20 DIAGNOSIS — E1159 Type 2 diabetes mellitus with other circulatory complications: Secondary | ICD-10-CM

## 2016-04-20 DIAGNOSIS — B372 Candidiasis of skin and nail: Secondary | ICD-10-CM

## 2016-04-20 DIAGNOSIS — E785 Hyperlipidemia, unspecified: Secondary | ICD-10-CM | POA: Diagnosis not present

## 2016-04-20 DIAGNOSIS — N521 Erectile dysfunction due to diseases classified elsewhere: Secondary | ICD-10-CM | POA: Diagnosis not present

## 2016-04-20 LAB — POCT GLYCOSYLATED HEMOGLOBIN (HGB A1C): HEMOGLOBIN A1C: 7.2

## 2016-04-20 NOTE — Patient Instructions (Addendum)
Patient Instructions  Please continue: - Metformin 1000 mg 2x a day, with meals - Invokana 100 mg in am - Glipizide XL 5 mg in am  Please come back for a follow-up appointment in 3 months with your sugar log.  Try to read Dr. Katherina RightNeal Barnard's book: "Program for Reversing Diabetes" for other ideas for healthy eating.

## 2016-04-20 NOTE — Progress Notes (Signed)
Patient ID: Douglas [name], male   DOB: 02-11-1946, 70 y.o.   MRN: [id number]  HPI: Douglas [name] is a 70 y.o.-year-old male, returning for f/u for DM2, dx in 2010, non-insulin-dependent, uncontrolled, with complications (ED). Last visit 3 mo ago.  Last hemoglobin A1c was: Lab Results  Component Value Date   HGBA1C 6.9 01/15/2016   HGBA1C 6.9 10/16/2015   HGBA1C 6.8 07/15/2015   Pt is on a regimen of: - Metformin 1000 mg 2x a day, with meals - Invokana 100 mg in am  >> 1 penile yeast inf, not resolved completely after a Diflucan tablet - Glipizide XL 5 mg in am  Pt checks his sugars 1x a day and they are higher: - am: 92-130, 183, 189 >> 87, 89, 110-161, 175-195 over Thanksgiving >> 92, 106-166, 179, 190 >> 114, 130-226 - 2h after b'fast: n/c - before lunch: n/c - 2h after lunch: n/c - before dinner: n/c - 2h after dinner: n/c >> 89-137, 162 - bedtime: n/c - nighttime: n/c No lows. Lowest sugar was 89 >> 87 >> 92; ? has hypoglycemia awareness. Highest sugar was 189 >> 205 >> 190.  Glucometer: [employer] Technology[employer] Contour Next  [employer] [employer] American PipelinePt's meals are (usually cooks his own meals): - Breakfast: scrambled eggs + sausage/bologna + coffee - Lunch: hot dog; burger - Dinner: salad + baked chicken - Snacks: fruit: bananas, apples, grapes  - no CKD, last BUN/creatinine:  Lab Results  Component Value Date   BUN 17 02/13/2016   CREATININE 1.06 02/13/2016   - last set of lipids: Lab Results  Component Value Date   CHOL 268* 02/13/2016   HDL 46.90 02/13/2016   LDLCALC 72 04/16/2015   LDLDIRECT 173.0 02/13/2016   TRIG 209.0* 02/13/2016   CHOLHDL 6 02/13/2016  On Lipitor >> stopped 1 week ago b/c AP >> will restart 1x a week - last eye exam was in 07/2015. No DR. + cataract - low grade. - no numbness and tingling in his feet. Last foot exam normal 12/2014.  ROS: Constitutional: no weight gain/loss, no fatigue, no subjective hyperthermia/hypothermia Eyes: no blurry vision, no xerophthalmia ENT: no  sore throat, no nodules palpated in throat, no dysphagia/odynophagia, no hoarseness Cardiovascular: no CP/SOB/palpitations/leg swelling Respiratory: no cough/SOB Gastrointestinal: no N/V/D/C Musculoskeletal: no muscle/joint aches Skin: no rashes Neurological: no tremors/numbness/tingling/dizziness  I reviewed pt's medications, allergies, PMH, social hx, family hx, and changes were documented in the history of present illness. Otherwise, unchanged from my initial visit note.  Past Medical History  Diagnosis Date  . GERD (gastroesophageal reflux disease)   . Allergic rhinitis due to pollen   . Impaired fasting glucose   . Hyperlipidemia   . ED (erectile dysfunction)   . Type II or unspecified type diabetes mellitus without mention of complication, not stated as uncontrolled   . BPH (benign prostatic hypertrophy)    Past Surgical History  Procedure Laterality Date  . Wrist surgery  1970's  . Kidney stone surgery  ~1990   Social History   Social History  . Marital Status: Divorced    Spouse Name: N/A  . Number of Children: 3  . Years of Education: N/A   Occupational History  . [employer]   Social History Main Topics  . Smoking status: Never Smoker   . Smokeless tobacco: Never Used  . Alcohol Use: No  . Drug Use: No  . Sexual Activity: Not on file   Other Topics Concern  . Not on file   Social  History Narrative   No living will.   Wife to be health care POA   Would want resuscitation attempts   Wouldn't want tube feeds if cognitively unaware   Current Outpatient Prescriptions on File Prior to Visit  Medication Sig Dispense Refill  . atorvastatin (LIPITOR) 40 MG tablet Take 1 tablet (40 mg total) by mouth daily. (Patient taking differently: Take 40 mg by mouth every other day. ) 90 tablet 3  . BAYER MICROLET LANCETS lancets Use as instructed to test blood sugar once daily dx:E11.65 100 each 3  . fluconazole (DIFLUCAN) 150 MG tablet Take 1 tablet (150 mg  total) by mouth once. 4 tablet 6  . glipiZIDE (GLUCOTROL XL) 5 MG 24 hr tablet TAKE 1 TABLET BY MOUTH DAILY WITH BREAKFAST. 90 tablet 3  . glucose blood test strip Use as instructed to test blood sugar once daily dx: E11.65 100 each 3  . INVOKANA 100 MG TABS tablet TAKE 1 TABLET BY MOUTH DAILY. 30 tablet 5  . meclizine (ANTIVERT) 25 MG tablet Take 1 tablet (25 mg total) by mouth 3 (three) times daily as needed for nausea (vertigo). 30 tablet 0  . metFORMIN (GLUCOPHAGE) 1000 MG tablet Take 1 tablet (1,000 mg total) by mouth 2 (two) times daily with a meal. 180 tablet 3  . sildenafil (REVATIO) 20 MG tablet Take 3-5 tablets (60-100 mg total) by mouth daily as needed. 50 tablet 11   No current facility-administered medications on file prior to visit.   No Known Allergies Family History  Problem Relation Age of Onset  . Cancer Mother   . Diabetes Daughter    PE: BP 118/80 mmHg  Pulse 99  Temp(Src) 97.5 F (36.4 C) (Oral)  Resp 12  Wt 177 lb (80.287 kg)  SpO2 95% Body mass index is 26.92 kg/(m^2). Wt Readings from Last 3 Encounters:  04/20/16 177 lb (80.287 kg)  02/13/16 176 lb (79.833 kg)  01/15/16 176 lb (79.833 kg)   Constitutional: normal weight, in NAD Eyes: PERRLA, EOMI, no exophthalmos ENT: moist mucous membranes, no thyromegaly, no cervical lymphadenopathy Cardiovascular: RRR, No MRG Respiratory: CTA B Gastrointestinal: abdomen soft, NT, ND, BS+ Musculoskeletal: no deformities, strength intact in all 4 Skin: moist, warm, no rashes Neurological: slight tremor with outstretched hands, DTR normal in all 4  ASSESSMENT: 1. DM2, non-insulin-dependent, uncontrolled, without complications  2. HL  3. Yeast infection  PLAN:  1. Patient with long-standing, uncontrolled diabetes, on oral antidiabetic regimen, with worse sugars now, due to dietary indiscretions. No change in regimen today, but we discussed about changing his diet to improve sugars in am. We may also move all  Metformin at night. -  I suggested to:  Patient Instructions  Please continue: - Metformin 1000 mg 2x a day, with meals - Invokana 100 mg in am - Glipizide XL 5 mg in am  Please come back for a follow-up appointment in 3 months with your sugar log.  - continue checking sugars at different times of the day - check once a day, rotating checks - given new sugar logs - advised for yearly eye exams >> he is UTD >> no DR - UTD with flu and PNA vaccines - check HbA1c today >> 7.2% (higher) - Return to clinic in 4 mo with sugar log   2. HL - AP with Lipitor >> advised to take it once a week >> he will start - also recommended Benecol  3. Yeast infection - penile - will repeat a Diflucan  course

## 2016-05-03 IMAGING — CR DG ABDOMEN 1V
2 series · 2 of 2 positions shown · non-contrast
Comparison: None.

CLINICAL DATA: Left flank pain and gross hematuria. History of
stones.

EXAM:
ABDOMEN - 1 VIEW

[view not recorded (1 of 2)]
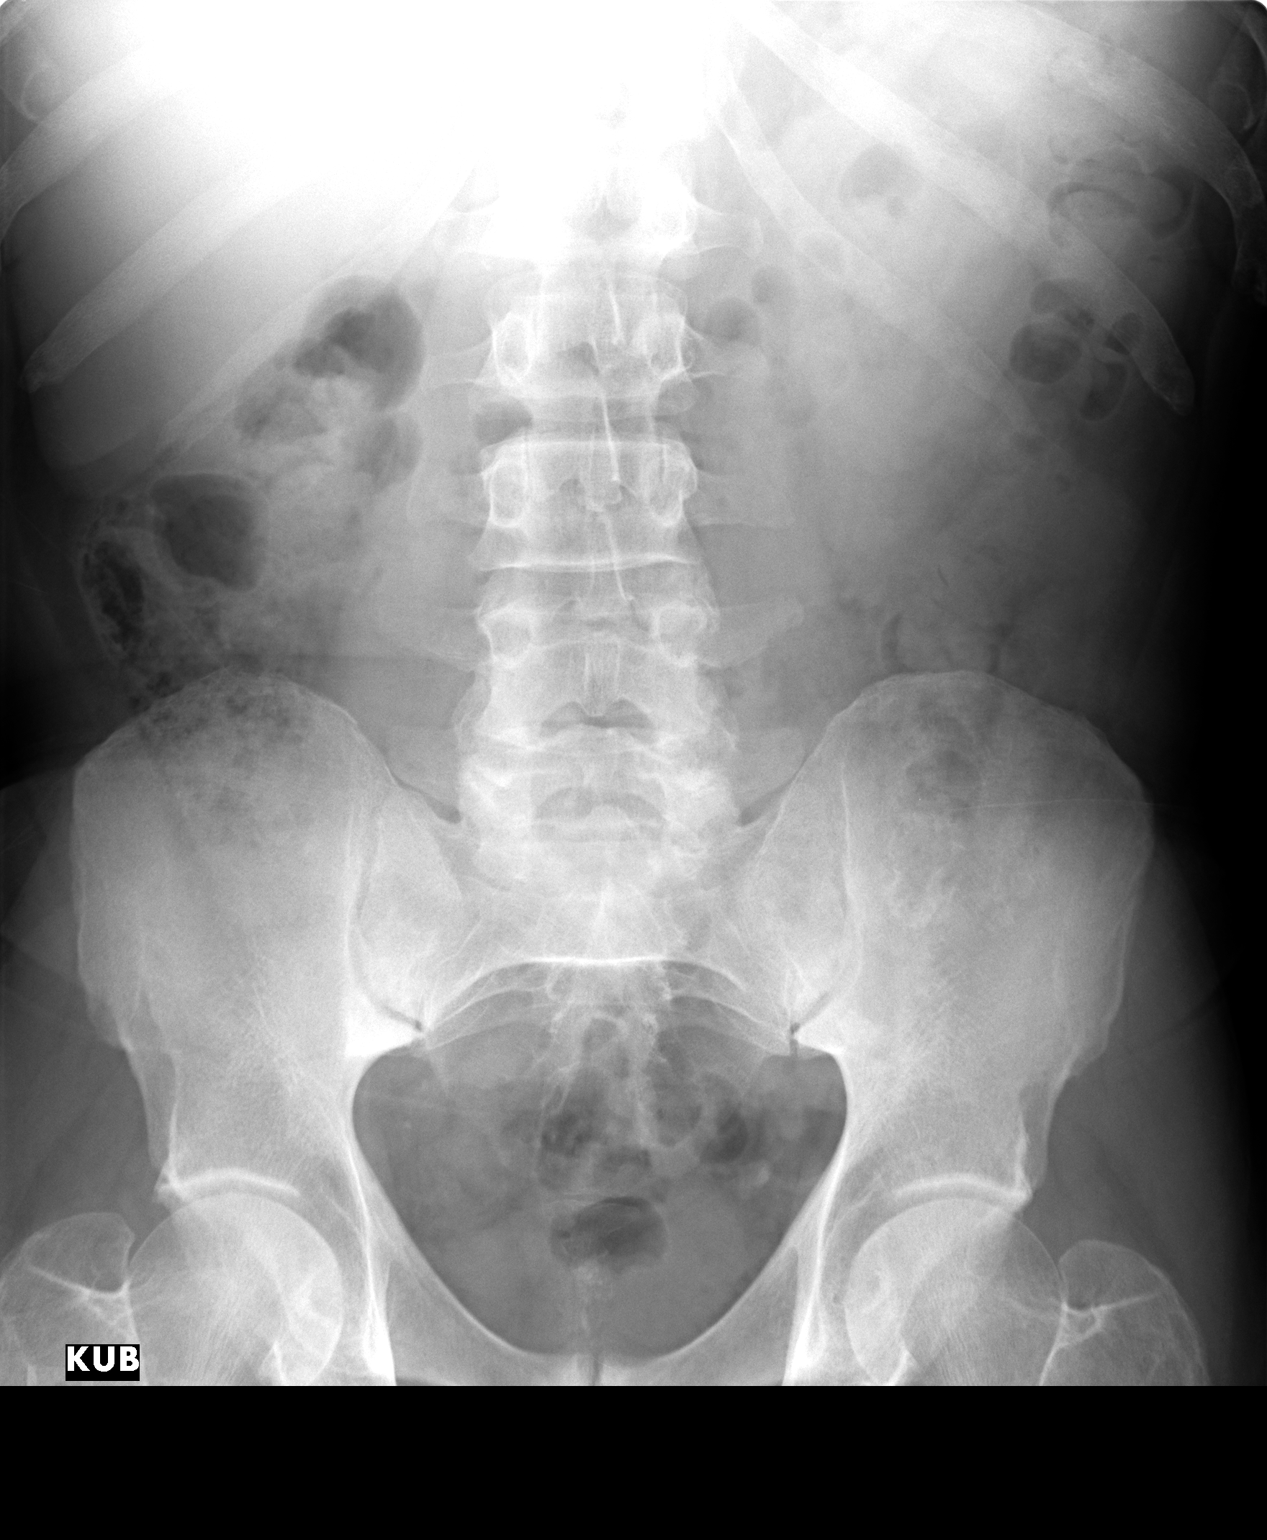

[view not recorded (2 of 2)]
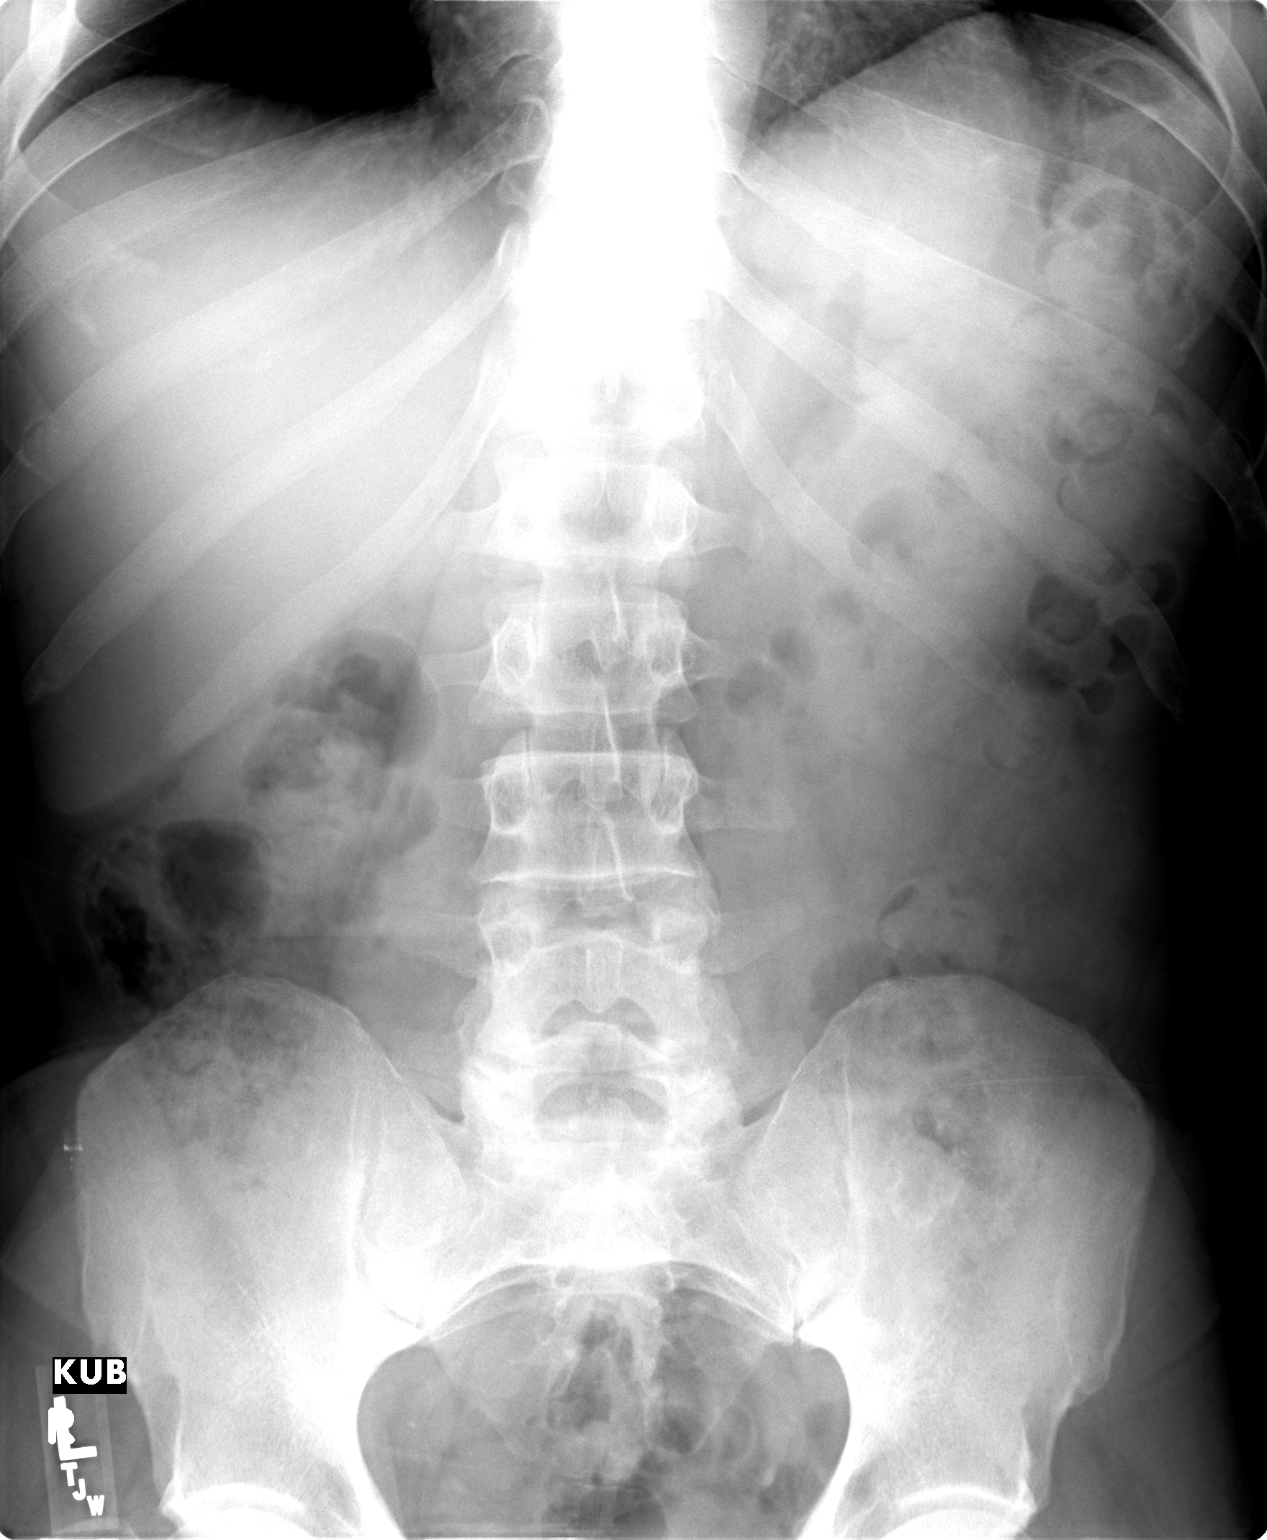

[2 of 2 positions shown; findings below may reference images not displayed]

FINDINGS: There is a density in the left pelvis the could potentially reflect
distal ureteral stone. Alternatively, may reflect phlebolith.

No other evidence of a ureteral stone. No evidence of an intrarenal
stone. Soft tissues are otherwise unremarkable.

Normal bowel gas pattern.

There are degenerative changes of the lower lumbar spine.
IMPRESSION: Possible stone in the distal left ureter versus phlebolith. No other
evidence of an acute abnormality.

## 2016-05-13 MED FILL — glipiZIDE ER 5 MG TB24: 5 | 90 days supply | Qty: 90 | Fill #1

## 2016-05-13 MED FILL — INVOKANA 100 MG TABLET: 100 | 30 days supply | Qty: 30 | Fill #2

## 2016-05-14 MED FILL — FLUCONAZOLE 150 MG TABLET: 150 | 2 days supply | Qty: 2 | Fill #3

## 2016-06-10 ENCOUNTER — Other Ambulatory Visit: Payer: Self-pay | Admitting: *Deleted

## 2016-06-10 MED ORDER — METFORMIN HCL 1000 MG PO TABS: 1000.0000 mg | ORAL_TABLET | Freq: Two times a day (BID) | ORAL | 3 refills | Status: DC

## 2016-06-10 MED FILL — metFORMIN HCL 1000 MG TABS: 1000 | 90 days supply | Qty: 180 | Fill #0

## 2016-06-22 MED FILL — INVOKANA 100 MG TABLET: 100 | 30 days supply | Qty: 30 | Fill #3

## 2016-06-22 MED FILL — FLUCONAZOLE 150 MG TABLET: 150 | 2 days supply | Qty: 2 | Fill #4

## 2016-07-09 MED FILL — FLUCONAZOLE 150 MG TABLET: 150 | 2 days supply | Qty: 2 | Fill #5

## 2016-07-23 MED FILL — INVOKANA 100 MG TABLET: 100 | 30 days supply | Qty: 30 | Fill #4

## 2016-07-24 MED FILL — FLUCONAZOLE 150 MG TABLET: 150 | 2 days supply | Qty: 2 | Fill #6

## 2016-08-11 MED FILL — FLUCONAZOLE 150 MG TABLET: 150 | 2 days supply | Qty: 2 | Fill #7

## 2016-08-20 ENCOUNTER — Ambulatory Visit: Payer: BLUE CROSS/BLUE SHIELD | Admitting: Internal Medicine

## 2016-08-20 DIAGNOSIS — Z0289 Encounter for other administrative examinations: Secondary | ICD-10-CM

## 2016-09-02 MED FILL — glipiZIDE ER 5 MG TB24: 5 | 90 days supply | Qty: 90 | Fill #2

## 2016-09-02 MED FILL — INVOKANA 100 MG TABLET: 100 | 30 days supply | Qty: 30 | Fill #5

## 2016-09-02 MED FILL — FLUCONAZOLE 150 MG TABLET: 150 | 2 days supply | Qty: 2 | Fill #8

## 2016-10-02 MED FILL — FLUCONAZOLE 150 MG TABLET: 150 | 2 days supply | Qty: 2 | Fill #9

## 2016-10-13 MED FILL — FLUCONAZOLE 150 MG TABLET: 150 | 2 days supply | Qty: 2 | Fill #10

## 2016-11-30 ENCOUNTER — Other Ambulatory Visit: Payer: Self-pay | Admitting: Internal Medicine

## 2016-11-30 MED FILL — metFORMIN HCL 1000 MG TABS: 1000 | 90 days supply | Qty: 180 | Fill #1

## 2016-11-30 MED FILL — ATORVASTATIN 40 MG TABLET: 40 | 90 days supply | Qty: 90 | Fill #0

## 2016-11-30 MED FILL — FLUCONAZOLE 150 MG TABLET: 150 | 2 days supply | Qty: 2 | Fill #11

## 2017-08-31 ENCOUNTER — Telehealth: Payer: Self-pay

## 2017-08-31 NOTE — Telephone Encounter (Signed)
Pt left note that he stopped invokana and metformin 3 months ago. Pt is not taking any medications at this time. Pt is having yeast type symptoms; pt has no other symptoms since stopping diabetic meds except now has yeast like symptoms; pt scheduled appt to see Dr Alphonsus Sias 09/01/17 at 8:30 for 30 ' appt. Pt last visit was annual exam on 02/13/2016. Pt will come fasting.FYI to Dr Alphonsus Sias. [facility] outpt pharmacy.

## 2017-08-31 NOTE — Telephone Encounter (Signed)
I would think that the yeast type symptoms (wherever they are) are related to sugars being high. I will discuss this with him at the upcoming appointment

## 2017-09-01 ENCOUNTER — Ambulatory Visit (INDEPENDENT_AMBULATORY_CARE_PROVIDER_SITE_OTHER): Payer: Self-pay | Admitting: Internal Medicine

## 2017-09-01 ENCOUNTER — Encounter: Payer: Self-pay | Admitting: Internal Medicine

## 2017-09-01 VITALS — BP 130/80 | HR 73 | Temp 97.3°F | Wt 173.2 lb

## 2017-09-01 DIAGNOSIS — E1142 Type 2 diabetes mellitus with diabetic polyneuropathy: Secondary | ICD-10-CM

## 2017-09-01 DIAGNOSIS — Z0000 Encounter for general adult medical examination without abnormal findings: Secondary | ICD-10-CM

## 2017-09-01 DIAGNOSIS — N481 Balanitis: Secondary | ICD-10-CM

## 2017-09-01 DIAGNOSIS — Z23 Encounter for immunization: Secondary | ICD-10-CM

## 2017-09-01 DIAGNOSIS — Z1211 Encounter for screening for malignant neoplasm of colon: Secondary | ICD-10-CM

## 2017-09-01 DIAGNOSIS — Z125 Encounter for screening for malignant neoplasm of prostate: Secondary | ICD-10-CM

## 2017-09-01 DIAGNOSIS — E785 Hyperlipidemia, unspecified: Secondary | ICD-10-CM

## 2017-09-01 LAB — COMPREHENSIVE METABOLIC PANEL
ALBUMIN: 4.3 g/dL (ref 3.5–5.2)
ALK PHOS: 79 U/L (ref 39–117)
ALT: 26 U/L (ref 0–53)
AST: 15 U/L (ref 0–37)
BILIRUBIN TOTAL: 0.5 mg/dL (ref 0.2–1.2)
BUN: 14 mg/dL (ref 6–23)
CO2: 28 mEq/L (ref 19–32)
Calcium: 9.9 mg/dL (ref 8.4–10.5)
Chloride: 101 mEq/L (ref 96–112)
Creatinine, Ser: 0.98 mg/dL (ref 0.40–1.50)
GFR: 97.27 mL/min (ref >60.00)
GLUCOSE: 270 mg/dL — AB (ref 70–99)
Potassium: 4.4 mEq/L (ref 3.5–5.1)
SODIUM: 139 meq/L (ref 135–145)
TOTAL PROTEIN: 7.2 g/dL (ref 6.0–8.3)

## 2017-09-01 LAB — CBC
HCT: 46.5 % (ref 39.0–52.0)
Hemoglobin: 15.3 g/dL (ref 13.0–17.0)
MCHC: 32.9 g/dL (ref 30.0–36.0)
MCV: 88.9 fl (ref 78.0–100.0)
Platelets: 198 10*3/uL (ref 150.0–400.0)
RBC: 5.23 Mil/uL (ref 4.22–5.81)
RDW: 14.2 % (ref 11.5–15.5)
WBC: 5 10*3/uL (ref 4.0–10.5)

## 2017-09-01 LAB — MICROALBUMIN / CREATININE URINE RATIO
CREATININE, U: 126.3 mg/dL
MICROALB UR: 5.7 mg/dL — AB (ref 0.0–1.9)
Microalb Creat Ratio: 4.5 mg/g (ref 0.0–30.0)

## 2017-09-01 LAB — HEMOGLOBIN A1C: Hgb A1c MFr Bld: 10.7 % — ABNORMAL HIGH (ref 4.6–6.5)

## 2017-09-01 LAB — PSA: PSA: 3.19 ng/mL (ref 0.10–4.00)

## 2017-09-01 LAB — HM DIABETES FOOT EXAM

## 2017-09-01 MED ORDER — GLIPIZIDE ER 10 MG PO TB24: 10.0000 mg | ORAL_TABLET | Freq: Every day | ORAL | 3 refills | Status: DC

## 2017-09-01 MED ORDER — FLUCONAZOLE 100 MG PO TABS: 100.0000 mg | ORAL_TABLET | Freq: Every day | ORAL | 2 refills | Status: DC

## 2017-09-01 MED ORDER — ATORVASTATIN CALCIUM 40 MG PO TABS: 40.0000 mg | ORAL_TABLET | Freq: Every day | ORAL | 3 refills | Status: DC

## 2017-09-01 MED ORDER — METFORMIN HCL 1000 MG PO TABS: 1000.0000 mg | ORAL_TABLET | Freq: Two times a day (BID) | ORAL | 3 refills | Status: DC

## 2017-09-01 MED FILL — FLUCONAZOLE 100 MG TABLET: 100 | 7 days supply | Qty: 7 | Fill #0 | Status: TO

## 2017-09-01 MED FILL — metFORMIN HCL 1000 MG TABS: 1000 | 90 days supply | Qty: 180 | Fill #0

## 2017-09-01 MED FILL — glipiZIDE ER 10 MG TB24: 10 | 90 days supply | Qty: 90 | Fill #0 | Status: TO

## 2017-09-01 MED FILL — ATORVASTATIN 40 MG TABLET: 40 | 90 days supply | Qty: 90 | Fill #0 | Status: TO

## 2017-09-01 NOTE — Assessment & Plan Note (Signed)
Initially from the invokana--now probably due to poor control 1 week fluconazole

## 2017-09-01 NOTE — Assessment & Plan Note (Signed)
Will check PSA after discussion (probably last one) FIT Flu vaccine today

## 2017-09-01 NOTE — Assessment & Plan Note (Signed)
Will restart the statin Check next time

## 2017-09-01 NOTE — Progress Notes (Signed)
Subjective:    Patient ID: Douglas [name], male    DOB: September 21, 1946, 71 y.o.   MRN: [id number]  HPI Here for physical  He stopped all his medications about 3 months ago "invokana was just killing me" Ongoing genital yeast--- feels tight and trouble pulling back foreskin Not checking sugars No foot numbness or pain--but has problems with balance   Continues to work full time for [employer]  Current Outpatient Prescriptions on File Prior to Visit  Medication Sig Dispense Refill  . atorvastatin (LIPITOR) 40 MG tablet TAKE 1 TABLET BY MOUTH DAILY. (Patient not taking: Reported on 09/01/2017) 90 tablet 0  . BAYER MICROLET LANCETS lancets Use as instructed to test blood sugar once daily dx:E11.65 (Patient not taking: Reported on 09/01/2017) 100 each 3  . glipiZIDE (GLUCOTROL XL) 5 MG 24 hr tablet TAKE 1 TABLET BY MOUTH DAILY WITH BREAKFAST. (Patient not taking: Reported on 09/01/2017) 90 tablet 3  . glucose blood test strip Use as instructed to test blood sugar once daily dx: E11.65 (Patient not taking: Reported on 09/01/2017) 100 each 3  . INVOKANA 100 MG TABS tablet TAKE 1 TABLET BY MOUTH DAILY. (Patient not taking: Reported on 09/01/2017) 30 tablet 2  . meclizine (ANTIVERT) 25 MG tablet Take 1 tablet (25 mg total) by mouth 3 (three) times daily as needed for nausea (vertigo). (Patient not taking: Reported on 09/01/2017) 30 tablet 0  . metFORMIN (GLUCOPHAGE) 1000 MG tablet Take 1 tablet (1,000 mg total) by mouth 2 (two) times daily with a meal. (Patient not taking: Reported on 09/01/2017) 180 tablet 3   No current facility-administered medications on file prior to visit.     No Known Allergies  Past Medical History:  Diagnosis Date  . Allergic rhinitis due to pollen   . BPH (benign prostatic hypertrophy)   . ED (erectile dysfunction)   . GERD (gastroesophageal reflux disease)   . Hyperlipidemia   . Type II or unspecified type diabetes mellitus without mention of complication, not  stated as uncontrolled     Past Surgical History:  Procedure Laterality Date  . KIDNEY STONE SURGERY  ~1990  . WRIST SURGERY  1970's    Family History  Problem Relation Age of Onset  . Cancer Mother   . Diabetes Daughter     Social History   Social History  . Marital status: Married    Spouse name: N/A  . Number of children: 3  . Years of education: N/A   Occupational History  . [employer]   Social History Main Topics  . Smoking status: Never Smoker  . Smokeless tobacco: Never Used  . Alcohol use No  . Drug use: No  . Sexual activity: Not on file   Other Topics Concern  . Not on file   Social History Narrative   Divorced---then remarried 2016   3 children and 1 step son      No living will.   Wife to be health care POA   Would want resuscitation attempts   Wouldn't want tube feeds if cognitively unaware   Review of Systems  Constitutional: Negative for fatigue and unexpected weight change.       Wears seat belt  HENT: Positive for dental problem. Negative for hearing loss, tinnitus and trouble swallowing.        Needs replacement of lower 2 incisors Partials up and down  Eyes: Negative for visual disturbance.       No diplopia or unilateral vision loss  Respiratory: Positive for cough. Negative for chest tightness and shortness of breath.   Cardiovascular: Negative for chest pain, palpitations and leg swelling.  Gastrointestinal: Negative for blood in stool, constipation and nausea.       Rare heartburn  Endocrine: Negative for polydipsia and polyuria.  Genitourinary: Positive for frequency.       Reasonable stream but has to wait for dribbling  Skin:       Had poison ivy on arm--better now  Allergic/Immunologic: Positive for environmental allergies. Negative for immunocompromised state.  Neurological: Positive for headaches. Negative for dizziness, syncope and light-headedness.  Hematological: Negative for adenopathy. Does not bruise/bleed  easily.  Psychiatric/Behavioral: Negative for dysphoric mood and sleep disturbance. The patient is not nervous/anxious.        Objective:   Physical Exam  Constitutional: He appears well-nourished. No distress.  HENT:  Head: Normocephalic and atraumatic.  Right Ear: External ear normal.  Left Ear: External ear normal.  Mouth/Throat: Oropharynx is clear and moist. No oropharyngeal exudate.  Eyes: Pupils are equal, round, and reactive to light. Conjunctivae are normal.  Neck: Normal range of motion. No thyromegaly present.  Cardiovascular: Normal rate, regular rhythm, normal heart sounds and intact distal pulses.  Exam reveals no gallop.   No murmur heard. Pulmonary/Chest: Effort normal and breath sounds normal. No respiratory distress. He has no wheezes. He has no rales.  Abdominal: Soft. There is no tenderness.  Musculoskeletal: He exhibits no edema or tenderness.  Lymphadenopathy:    He has no cervical adenopathy.  Neurological:  Reduced sensation to monofilament laterally in both feet  Skin: No rash noted.  Some plantar callous but no ulcers  Psychiatric: He has a normal mood and affect. His behavior is normal.          Assessment & Plan:

## 2017-09-01 NOTE — Addendum Note (Signed)
Addended by: Desmond DikeKNIGHT, Aliou Mealey H on: 09/01/2017 09:24 AM   Modules accepted: Orders

## 2017-09-01 NOTE — Assessment & Plan Note (Signed)
Uncontrolled due to stopping meds Will restart the glipizide and metformin Early neuropathy

## 2017-09-01 NOTE — Patient Instructions (Signed)
Please restart all the medications on the list.

## 2017-09-23 ENCOUNTER — Other Ambulatory Visit (INDEPENDENT_AMBULATORY_CARE_PROVIDER_SITE_OTHER): Payer: BLUE CROSS/BLUE SHIELD

## 2017-09-23 DIAGNOSIS — Z1211 Encounter for screening for malignant neoplasm of colon: Secondary | ICD-10-CM

## 2017-09-23 LAB — FECAL OCCULT BLOOD, IMMUNOCHEMICAL: FECAL OCCULT BLD: NEGATIVE

## 2018-07-01 ENCOUNTER — Encounter: Payer: Self-pay | Admitting: Internal Medicine

## 2018-07-01 ENCOUNTER — Ambulatory Visit (INDEPENDENT_AMBULATORY_CARE_PROVIDER_SITE_OTHER): Payer: Self-pay | Admitting: Internal Medicine

## 2018-07-01 ENCOUNTER — Telehealth: Payer: Self-pay

## 2018-07-01 VITALS — BP 132/84 | HR 83 | Temp 97.9°F | Ht 67.5 in | Wt 172.0 lb

## 2018-07-01 DIAGNOSIS — E1142 Type 2 diabetes mellitus with diabetic polyneuropathy: Secondary | ICD-10-CM

## 2018-07-01 LAB — POCT GLYCOSYLATED HEMOGLOBIN (HGB A1C): Hemoglobin A1C: 12.8 % — AB (ref 4.0–5.6)

## 2018-07-01 MED ORDER — SITAGLIPTIN PHOSPHATE 100 MG PO TABS: 100.0000 mg | ORAL_TABLET | Freq: Every day | ORAL | 11 refills | Status: DC

## 2018-07-01 MED ORDER — ATORVASTATIN CALCIUM 40 MG PO TABS: 40.0000 mg | ORAL_TABLET | Freq: Every day | ORAL | 3 refills | Status: DC

## 2018-07-01 MED ORDER — FLUCONAZOLE 100 MG PO TABS: 100.0000 mg | ORAL_TABLET | Freq: Every day | ORAL | 2 refills | Status: DC

## 2018-07-01 MED ORDER — GLIPIZIDE ER 10 MG PO TB24: 10.0000 mg | ORAL_TABLET | Freq: Every day | ORAL | 3 refills | Status: DC

## 2018-07-01 MED ORDER — METFORMIN HCL 1000 MG PO TABS: 1000.0000 mg | ORAL_TABLET | Freq: Two times a day (BID) | ORAL | 3 refills | Status: DC

## 2018-07-01 NOTE — Telephone Encounter (Signed)
Copied from CRM [id number]. Topic: General - Other >> Jul 01, 2018 11:11 AM Marylen PontoMcneil, Ja-Kwan wrote: Reason for CRM: Pt states he went to pick up the new Rx and the medication was over $500 and he can not afford it. Pt request that an alternate Rx be sent to the pharmacy. Cb# [phone]

## 2018-07-01 NOTE — Progress Notes (Signed)
Subjective:    Patient ID: Douglas [name], male    DOB: Aug 07, 1946, 72 y.o.   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 atorvastatin (LIPITOR) 40 MG tablet Take 1 tablet (40 mg total) by mouth daily. 90 tablet 3  . BAYER MICROLET LANCETS lancets Use as instructed to test blood sugar once daily dx:E11.65 100 each 3  . fluconazole (DIFLUCAN) 100 MG tablet Take 1 tablet (100 mg total) by mouth daily. 7 tablet 2  . glipiZIDE (GLUCOTROL XL) 10 MG 24 hr tablet Take 1 tablet (10 mg total) by mouth daily with breakfast. 90 tablet 3  . glucose blood test strip Use as instructed to test blood sugar once daily dx: E11.65 100 each 3  . metFORMIN (GLUCOPHAGE) 1000 MG tablet Take 1 tablet (1,000 mg total) by mouth 2 (two) times daily with a meal. 180 tablet 3   No current facility-administered medications on file prior to visit.     No Known Allergies  Past Medical History:  Diagnosis Date  . Allergic rhinitis due to pollen   . BPH (benign prostatic hypertrophy)   . ED (erectile dysfunction)   . GERD (gastroesophageal reflux disease)   . Hyperlipidemia   . Type II or unspecified type diabetes mellitus without mention of complication, not stated as uncontrolled     Past Surgical History:  Procedure Laterality Date  . KIDNEY STONE SURGERY  ~1990  . WRIST SURGERY  1970's    Family History  Problem Relation Age of Onset  . Cancer Mother   . Diabetes Daughter     Social History   Socioeconomic History  . Marital status: Married    Spouse name: Not on file  . Number of  children: 3  . Years of education: Not on file  . Highest education level: Not on file  Occupational History  . Occupation: Zone Event organisermanager    Employer: [employer]  Social Needs  . Financial resource strain: Not on file  . Food insecurity:    Worry: Not on file    Inability: Not on file  . Transportation needs:    Medical: Not on file    Non-medical: Not on file  Tobacco Use  . Smoking status: Never Smoker  . Smokeless tobacco: Never Used  Substance and Sexual Activity  . Alcohol use: No  . Drug use: No  . Sexual activity: Not on file  Lifestyle  . Physical activity:    Days per week: Not on file    Minutes per session: Not on file  . Stress: Not on file  Relationships  . Social connections:    Talks on phone: Not on file    Gets together: Not on file    Attends religious service: Not on file    Active member of club or organization: Not on file    Attends meetings of clubs or organizations: Not on file    Relationship status: Not on file  . Intimate partner violence:    Fear of current or ex partner: Not on file    Emotionally abused: Not on  file    Physically abused: Not on file    Forced sexual activity: Not on file  Other Topics Concern  . Not on file  Social History Narrative   Divorced---then remarried 2016   3 children and 1 step son      No living will.   Wife to be health care POA   Would want resuscitation attempts   Wouldn't want tube feeds if cognitively unaware   Review of Systems Appetite is good Weight fairly stable Sleeps well    Objective:   Physical Exam  Constitutional: He appears well-developed. No distress.  Neck: No thyromegaly present.  Cardiovascular: Normal rate, regular rhythm, normal heart sounds and intact distal pulses. Exam reveals no gallop.  No murmur heard. Respiratory: Effort normal and breath sounds normal. No respiratory distress. He has no wheezes. He has no rales.  Musculoskeletal: He exhibits no edema.  Lymphadenopathy:      He has no cervical adenopathy.  Neurological:  Normal sensation inf eet  Skin:  No foot lesions  Psychiatric: He has a normal mood and affect. His behavior is normal.           Assessment & Plan:

## 2018-07-01 NOTE — Telephone Encounter (Signed)
Pt was seen earlier today and pt was to cb if [place]uvia was too expensive.Please advise.

## 2018-07-01 NOTE — Patient Instructions (Signed)
Continue the glipizide daily. Increase the metformin to twice a day. Add the new medication, januvia. If it is too expensive, let me know and I will try to get you something cheaper. Cut out all sugar, cakes and doughnuts and sugared drinks.

## 2018-07-01 NOTE — Telephone Encounter (Signed)
Please check with the pharmacist to see if there is any alternative in that class that is less money. Would also consider liraglutide or dulaglutide (but doubt they would be less) Alternative would be insulin---not sure he can manage insulin

## 2018-07-01 NOTE — Assessment & Plan Note (Addendum)
Symptoms suggest he is not well controlled still Still puts sugar in coffee, etc Lab Results  Component Value Date   HGBA1C 12.8 (A) 07/01/2018   Will increase metformin to bid [place]

## 2018-07-05 NOTE — Telephone Encounter (Signed)
Spoke to pt. He will come by the office and sign the form and give me his income information.

## 2018-09-30 ENCOUNTER — Encounter: Payer: Self-pay | Admitting: Internal Medicine

## 2018-09-30 ENCOUNTER — Ambulatory Visit (INDEPENDENT_AMBULATORY_CARE_PROVIDER_SITE_OTHER): Payer: Self-pay | Admitting: Internal Medicine

## 2018-09-30 VITALS — BP 114/80 | HR 95 | Temp 97.5°F | Ht 68.0 in | Wt 174.0 lb

## 2018-09-30 DIAGNOSIS — E1142 Type 2 diabetes mellitus with diabetic polyneuropathy: Secondary | ICD-10-CM

## 2018-09-30 DIAGNOSIS — Z23 Encounter for immunization: Secondary | ICD-10-CM

## 2018-09-30 LAB — POCT GLYCOSYLATED HEMOGLOBIN (HGB A1C): Hemoglobin A1C: 10 % — AB (ref 4.0–5.6)

## 2018-09-30 NOTE — Progress Notes (Signed)
Subjective:    Patient ID: Douglas [name], male    DOB: [birth year], 72 y.o.   MRN: [id number]  HPI Here for follow up of diabetes Has been better about the metformin being bid Continues on the glipizide Didn't start the januvia--due to cost  Finally trying to get into a health plan on Medicare Working with [employer] and having problems Is on the A Did retire  Doesn't feel as dehydrated Not as much of the polydipsia and polyuria Has cut out sugar  Current Outpatient Medications on File Prior to Visit  Medication Sig Dispense Refill  . atorvastatin (LIPITOR) 40 MG tablet Take 1 tablet (40 mg total) by mouth daily. 90 tablet 3  . BAYER MICROLET LANCETS lancets Use as instructed to test blood sugar once daily dx:E11.65 100 each 3  . glipiZIDE (GLUCOTROL XL) 10 MG 24 hr tablet Take 1 tablet (10 mg total) by mouth daily with breakfast. 90 tablet 3  . glucose blood test strip Use as instructed to test blood sugar once daily dx: E11.65 100 each 3  . metFORMIN (GLUCOPHAGE) 1000 MG tablet Take 1 tablet (1,000 mg total) by mouth 2 (two) times daily with a meal. 180 tablet 3  . fluconazole (DIFLUCAN) 100 MG tablet Take 1 tablet (100 mg total) by mouth daily. (Patient not taking: Reported on 09/30/2018) 7 tablet 2  . sitaGLIPtin (JANUVIA) 100 MG tablet Take 1 tablet (100 mg total) by mouth daily. (Patient not taking: Reported on 09/30/2018) 30 tablet 11   No current facility-administered medications on file prior to visit.     No Known Allergies  Past Medical History:  Diagnosis Date  . Allergic rhinitis due to pollen   . BPH (benign prostatic hypertrophy)   . ED (erectile dysfunction)   . GERD (gastroesophageal reflux disease)   . Hyperlipidemia   . Type II or unspecified type diabetes mellitus without mention of complication, not stated as uncontrolled     Past Surgical History:  Procedure Laterality Date  . KIDNEY STONE SURGERY  ~1990  . WRIST SURGERY  1970's    Family History   Problem Relation Age of Onset  . Cancer Mother   . Diabetes Daughter     Social History   Socioeconomic History  . Marital status: Married    Spouse name: Not on file  . Number of children: 3  . Years of education: Not on file  . Highest education level: Not on file  Occupational History  . Occupation: Public affairs consultantZone manager    Employer: [employer]    Comment: Retired  . Occupation: CuratorMechanic    Comment: work on side  Social Needs  . Financial resource strain: Not on file  . Food insecurity:    Worry: Not on file    Inability: Not on file  . Transportation needs:    Medical: Not on file    Non-medical: Not on file  Tobacco Use  . Smoking status: Never Smoker  . Smokeless tobacco: Never Used  Substance and Sexual Activity  . Alcohol use: No  . Drug use: No  . Sexual activity: Not on file  Lifestyle  . Physical activity:    Days per week: Not on file    Minutes per session: Not on file  . Stress: Not on file  Relationships  . Social connections:    Talks on phone: Not on file    Gets together: Not on file    Attends religious service: Not on file  Active member of club or organization: Not on file    Attends meetings of clubs or organizations: Not on file    Relationship status: Not on file  . Intimate partner violence:    Fear of current or ex partner: Not on file    Emotionally abused: Not on file    Physically abused: Not on file    Forced sexual activity: Not on file  Other Topics Concern  . Not on file  Social History Narrative   Divorced---then remarried 2016   3 children and 1 step son      No living will.   Wife to be health care POA   Would want resuscitation attempts   Wouldn't want tube feeds if cognitively unaware   Review of Systems  Being more careful with eating--what he was taught at the counseling Weight is stable Sleeps fine No foot pain or numbness     Objective:   Physical Exam  Constitutional: He appears well-developed. No distress.    Neck: No thyromegaly present.  Cardiovascular: Normal rate, regular rhythm, normal heart sounds and intact distal pulses. Exam reveals no gallop.  No murmur heard. Respiratory: Effort normal and breath sounds normal. No respiratory distress. He has no wheezes. He has no rales.  Musculoskeletal: He exhibits no edema.  Lymphadenopathy:    He has no cervical adenopathy.  Neurological:  Normal sensation in feet  Skin:  No foot lesions  Psychiatric: He has a normal mood and affect. His behavior is normal.           Assessment & Plan:

## 2018-09-30 NOTE — Assessment & Plan Note (Signed)
Lab Results  Component Value Date   HGBA1C 10.0 (A) 09/30/2018   Markedly better with dietary changes Will try to get the [place]uvia also

## 2019-03-24 ENCOUNTER — Other Ambulatory Visit: Payer: Self-pay | Admitting: Internal Medicine

## 2019-03-24 DIAGNOSIS — E1142 Type 2 diabetes mellitus with diabetic polyneuropathy: Secondary | ICD-10-CM

## 2019-03-29 ENCOUNTER — Other Ambulatory Visit: Payer: Self-pay

## 2019-03-29 NOTE — Addendum Note (Signed)
Addended by: Alvina Chou on: 03/29/2019 11:54 AM   Modules accepted: Orders

## 2019-03-31 ENCOUNTER — Ambulatory Visit (INDEPENDENT_AMBULATORY_CARE_PROVIDER_SITE_OTHER): Payer: Self-pay | Admitting: Internal Medicine

## 2019-03-31 ENCOUNTER — Encounter: Payer: Self-pay | Admitting: Internal Medicine

## 2019-03-31 ENCOUNTER — Other Ambulatory Visit (INDEPENDENT_AMBULATORY_CARE_PROVIDER_SITE_OTHER): Payer: Self-pay

## 2019-03-31 VITALS — BP 110/70 | HR 84 | Temp 97.5°F | Ht 68.0 in | Wt 170.0 lb

## 2019-03-31 DIAGNOSIS — E1142 Type 2 diabetes mellitus with diabetic polyneuropathy: Secondary | ICD-10-CM

## 2019-03-31 LAB — LIPID PANEL
Cholesterol: 212 mg/dL — ABNORMAL HIGH (ref 0–200)
HDL: 42 mg/dL (ref >39.00)
LDL Cholesterol: 139 mg/dL — ABNORMAL HIGH (ref 0–99)
NonHDL: 170.41
Total CHOL/HDL Ratio: 5
Triglycerides: 158 mg/dL — ABNORMAL HIGH (ref 0.0–149.0)
VLDL: 31.6 mg/dL (ref 0.0–40.0)

## 2019-03-31 LAB — CBC WITH DIFFERENTIAL/PLATELET
Basophils Absolute: 0 10*3/uL (ref 0.0–0.1)
Basophils Relative: 0.7 % (ref 0.0–3.0)
Eosinophils Absolute: 0.3 10*3/uL (ref 0.0–0.7)
Eosinophils Relative: 7.2 % — ABNORMAL HIGH (ref 0.0–5.0)
HCT: 43.2 % (ref 39.0–52.0)
Hemoglobin: 14.5 g/dL (ref 13.0–17.0)
Lymphocytes Relative: 40.1 % (ref 12.0–46.0)
Lymphs Abs: 1.7 10*3/uL (ref 0.7–4.0)
MCHC: 33.7 g/dL (ref 30.0–36.0)
MCV: 88.1 fl (ref 78.0–100.0)
Monocytes Absolute: 0.4 10*3/uL (ref 0.1–1.0)
Monocytes Relative: 10.4 % (ref 3.0–12.0)
Neutro Abs: 1.8 10*3/uL (ref 1.4–7.7)
Neutrophils Relative %: 41.6 % — ABNORMAL LOW (ref 43.0–77.0)
Platelets: 204 10*3/uL (ref 150.0–400.0)
RBC: 4.9 Mil/uL (ref 4.22–5.81)
RDW: 14.4 % (ref 11.5–15.5)
WBC: 4.3 10*3/uL (ref 4.0–10.5)

## 2019-03-31 LAB — COMPREHENSIVE METABOLIC PANEL
ALT: 28 U/L (ref 0–53)
AST: 18 U/L (ref 0–37)
Albumin: 4.1 g/dL (ref 3.5–5.2)
Alkaline Phosphatase: 81 U/L (ref 39–117)
BUN: 17 mg/dL (ref 6–23)
CO2: 26 mEq/L (ref 19–32)
Calcium: 9.2 mg/dL (ref 8.4–10.5)
Chloride: 101 mEq/L (ref 96–112)
Creatinine, Ser: 1.02 mg/dL (ref 0.40–1.50)
GFR: 86.99 mL/min (ref >60.00)
Glucose, Bld: 370 mg/dL — ABNORMAL HIGH (ref 70–99)
Potassium: 4.3 mEq/L (ref 3.5–5.1)
Sodium: 136 mEq/L (ref 135–145)
Total Bilirubin: 0.4 mg/dL (ref 0.2–1.2)
Total Protein: 7.1 g/dL (ref 6.0–8.3)

## 2019-03-31 LAB — HEMOGLOBIN A1C: Hgb A1c MFr Bld: 11.4 % — ABNORMAL HIGH (ref 4.6–6.5)

## 2019-03-31 LAB — HM DIABETES FOOT EXAM

## 2019-03-31 MED ORDER — GLIPIZIDE 10 MG PO TABS: 10.0000 mg | ORAL_TABLET | Freq: Two times a day (BID) | ORAL | 3 refills | Status: DC

## 2019-03-31 NOTE — Assessment & Plan Note (Signed)
Hopefully better control Can't afford newer medications--will change to regular glipizide to save him money At most, very early neuropathy On statin

## 2019-03-31 NOTE — Progress Notes (Signed)
Subjective:    Patient ID: Douglas [name], male    DOB: 07-11-1946, 73 y.o.   MRN: [id number]  HPI Here for follow up of diabetes and other chronic health conditions  He feels he is doing the right things Continues on his medications--always takes the metformin, has run out of glipizide due to expense (has missed 10-12 days in past 3 months) Has been exercising a lot Also, part time job Writerassembling mowers and grills for [employer] Hasn't been checking sugars No foot lesions Due for eye exam  Taking the statin No problems with myalgias Occasionally "stomach gets out of whack" ---nothing bothersome  No chest pain--but occasional "prickly" feeling in chest Will have some unusual sensations in legs--not persistent No SOB---other than from his allergies (uses zyrtec) No edema  Voids okay   Current Outpatient Medications on File Prior to Visit  Medication Sig Dispense Refill  . atorvastatin (LIPITOR) 40 MG tablet Take 1 tablet (40 mg total) by mouth daily. 90 tablet 3  . BAYER MICROLET LANCETS lancets Use as instructed to test blood sugar once daily dx:E11.65 100 each 3  . fluconazole (DIFLUCAN) 100 MG tablet Take 1 tablet (100 mg total) by mouth daily. 7 tablet 2  . glucose blood test strip Use as instructed to test blood sugar once daily dx: E11.65 100 each 3  . metFORMIN (GLUCOPHAGE) 1000 MG tablet Take 1 tablet (1,000 mg total) by mouth 2 (two) times daily with a meal. 180 tablet 3  . [DISCONTINUED] glipiZIDE (GLUCOTROL XL) 10 MG 24 hr tablet Take 1 tablet (10 mg total) by mouth daily with breakfast. 90 tablet 3   No current facility-administered medications on file prior to visit.     No Known Allergies  Past Medical History:  Diagnosis Date  . Allergic rhinitis due to pollen   . BPH (benign prostatic hypertrophy)   . ED (erectile dysfunction)   . GERD (gastroesophageal reflux disease)   . Hyperlipidemia   . Type II or unspecified type diabetes mellitus without mention of  complication, not stated as uncontrolled     Past Surgical History:  Procedure Laterality Date  . KIDNEY STONE SURGERY  ~1990  . WRIST SURGERY  1970's    Family History  Problem Relation Age of Onset  . Cancer Mother   . Diabetes Daughter     Social History   Socioeconomic History  . Marital status: Married    Spouse name: Not on file  . Number of children: 3  . Years of education: Not on file  . Highest education level: Not on file  Occupational History  . Occupation: Public affairs consultantZone manager    Employer: [employer]    Comment: Retired  . Occupation: CuratorMechanic    Comment: work on side  Social Needs  . Financial resource strain: Not on file  . Food insecurity:    Worry: Not on file    Inability: Not on file  . Transportation needs:    Medical: Not on file    Non-medical: Not on file  Tobacco Use  . Smoking status: Never Smoker  . Smokeless tobacco: Never Used  Substance and Sexual Activity  . Alcohol use: No  . Drug use: No  . Sexual activity: Not on file  Lifestyle  . Physical activity:    Days per week: Not on file    Minutes per session: Not on file  . Stress: Not on file  Relationships  . Social connections:    Talks on phone:  Not on file    Gets together: Not on file    Attends religious service: Not on file    Active member of club or organization: Not on file    Attends meetings of clubs or organizations: Not on file    Relationship status: Not on file  . Intimate partner violence:    Fear of current or ex partner: Not on file    Emotionally abused: Not on file    Physically abused: Not on file    Forced sexual activity: Not on file  Other Topics Concern  . Not on file  Social History Narrative   Divorced---then remarried 2016   3 children and 1 step son      No living will.   Wife to be health care POA   Would want resuscitation attempts   Wouldn't want tube feeds if cognitively unaware   Review of Systems Still has some tingling and decreased  sensation in left fingers Also some stiffiness in left neck/shoulder (points to trapezius) Has lost a few pounds    Objective:   Physical Exam  Constitutional: He appears well-developed. No distress.  Neck: No thyromegaly present.  Cardiovascular: Normal rate, regular rhythm, normal heart sounds and intact distal pulses. Exam reveals no gallop.  No murmur heard. Respiratory: Effort normal and breath sounds normal. No respiratory distress. He has no wheezes. He has no rales.  GI: Soft. There is no abdominal tenderness.  Musculoskeletal:        General: No tenderness or edema.  Lymphadenopathy:    He has no cervical adenopathy.  Neurological:  Fairly normal sensation in feet  Skin:  Dry skin on feet but no ulcers or lesions  Psychiatric: He has a normal mood and affect. His behavior is normal.           Assessment & Plan:

## 2019-09-07 ENCOUNTER — Other Ambulatory Visit: Payer: Self-pay | Admitting: Internal Medicine

## 2019-10-06 ENCOUNTER — Ambulatory Visit: Payer: Self-pay | Admitting: Internal Medicine

## 2019-10-06 ENCOUNTER — Encounter: Payer: Self-pay | Admitting: Internal Medicine

## 2019-10-06 ENCOUNTER — Other Ambulatory Visit: Payer: Self-pay

## 2019-10-06 VITALS — BP 124/84 | HR 87 | Temp 97.3°F | Ht 68.0 in | Wt 172.0 lb

## 2019-10-06 DIAGNOSIS — Z23 Encounter for immunization: Secondary | ICD-10-CM

## 2019-10-06 DIAGNOSIS — E1142 Type 2 diabetes mellitus with diabetic polyneuropathy: Secondary | ICD-10-CM

## 2019-10-06 LAB — POCT GLYCOSYLATED HEMOGLOBIN (HGB A1C): Hemoglobin A1C: 11.3 % — AB (ref 4.0–5.6)

## 2019-10-06 MED ORDER — PIOGLITAZONE HCL 30 MG PO TABS: 30.0000 mg | ORAL_TABLET | Freq: Every day | ORAL | 3 refills | Status: DC

## 2019-10-06 MED ORDER — METFORMIN HCL 1000 MG PO TABS: 1000.0000 mg | ORAL_TABLET | Freq: Two times a day (BID) | ORAL | 3 refills | Status: DC

## 2019-10-06 NOTE — Assessment & Plan Note (Addendum)
Lab Results  Component Value Date   HGBA1C 11.3 (A) 10/06/2019   No better now Will add actos 30mg  now---discussed watching for edema/SOB Continue other meds See back 3 months

## 2019-10-06 NOTE — Progress Notes (Signed)
Subjective:    Patient ID: Douglas [name], male    DOB: [birth year], 73 y.o.   MRN: [id number]  HPI Here for follow up of diabetes This visit occurred during the SARS-CoV-2 public health emergency.  Safety protocols were in place, including screening questions prior to the visit, additional usage of staff PPE, and extensive cleaning of exam room while observing appropriate contact time as indicated for disinfecting solutions.   He feels fine Knows his sugars are not going to be good Eating a little more---but weight stable Most of the time he is taking the metformin and glipizide No problems with feet but hands stay numb/tingling  Still working for [employer]  No chest pain  No SOB No dizziness or syncope  Current Outpatient Medications on File Prior to Visit  Medication Sig Dispense Refill  . atorvastatin (LIPITOR) 40 MG tablet Take 1 tablet (40 mg total) by mouth daily. 90 tablet 3  . BAYER MICROLET LANCETS lancets Use as instructed to test blood sugar once daily dx:E11.65 100 each 3  . fluconazole (DIFLUCAN) 100 MG tablet Take 1 tablet by mouth once daily 7 tablet 0  . glipiZIDE (GLUCOTROL) 10 MG tablet Take 1 tablet (10 mg total) by mouth 2 (two) times daily before a meal. 180 tablet 3  . glucose blood test strip Use as instructed to test blood sugar once daily dx: E11.65 100 each 3  . metFORMIN (GLUCOPHAGE) 1000 MG tablet Take 1 tablet (1,000 mg total) by mouth 2 (two) times daily with a meal. 180 tablet 3   No current facility-administered medications on file prior to visit.     No Known Allergies  Past Medical History:  Diagnosis Date  . Allergic rhinitis due to pollen   . BPH (benign prostatic hypertrophy)   . ED (erectile dysfunction)   . GERD (gastroesophageal reflux disease)   . Hyperlipidemia   . Type II or unspecified type diabetes mellitus without mention of complication, not stated as uncontrolled     Past Surgical History:  Procedure Laterality Date  .  KIDNEY STONE SURGERY  ~1990  . WRIST SURGERY  1970's    Family History  Problem Relation Age of Onset  . Cancer Mother   . Diabetes Daughter     Social History   Socioeconomic History  . Marital status: Married    Spouse name: Not on file  . Number of children: 3  . Years of education: Not on file  . Highest education level: Not on file  Occupational History  . Occupation: Public affairs consultant: [email or web address]    Comment: Retired  . Occupation: Curator    Comment: work on side  Social Needs  . Financial resource strain: Not on file  . Food insecurity    Worry: Not on file    Inability: Not on file  . Transportation needs    Medical: Not on file    Non-medical: Not on file  Tobacco Use  . Smoking status: Never Smoker  . Smokeless tobacco: Never Used  Substance and Sexual Activity  . Alcohol use: No  . Drug use: No  . Sexual activity: Not on file  Lifestyle  . Physical activity    Days per week: Not on file    Minutes per session: Not on file  . Stress: Not on file  Relationships  . Social Musician on phone: Not on file    Gets together: Not on file  Attends religious service: Not on file    Active member of club or organization: Not on file    Attends meetings of clubs or organizations: Not on file    Relationship status: Not on file  . Intimate partner violence    Fear of current or ex partner: Not on file    Emotionally abused: Not on file    Physically abused: Not on file    Forced sexual activity: Not on file  Other Topics Concern  . Not on file  Social History Narrative   Divorced---then remarried 2016   3 children and 1 step son      No living will.   Wife to be health care POA   Would want resuscitation attempts   Wouldn't want tube feeds if cognitively unaware   Review of Systems Sleeps well Weight is stable    Objective:   Physical Exam  Constitutional: He appears well-developed. No distress.  Cardiovascular: Normal rate,  regular rhythm, normal heart sounds and intact distal pulses. Exam reveals no gallop.  No murmur heard. Respiratory: Effort normal and breath sounds normal. No respiratory distress. He has no wheezes. He has no rales.  Musculoskeletal:        General: No edema.  Psychiatric: He has a normal mood and affect. His behavior is normal.           Assessment & Plan:

## 2019-10-06 NOTE — Addendum Note (Signed)
Addended by: Pilar Grammes on: 10/06/2019 09:48 AM   Modules accepted: Orders

## 2019-10-06 NOTE — Patient Instructions (Signed)
Start the pioglitazone daily. Continue all the other medications as well.

## 2019-12-13 ENCOUNTER — Other Ambulatory Visit: Payer: Self-pay | Admitting: Internal Medicine

## 2020-01-08 ENCOUNTER — Ambulatory Visit: Payer: Self-pay | Attending: Family

## 2020-01-08 DIAGNOSIS — Z23 Encounter for immunization: Secondary | ICD-10-CM | POA: Insufficient documentation

## 2020-01-08 NOTE — Progress Notes (Signed)
   Covid-[facility] Vaccination [facility]  Name:  Douglas [name]    MRN: [id number] DOB: 1945/11/25  01/08/2020  [name] was observed post Covid-19 immunization for 15 minutes without incidence. He was provided with Vaccine Information Sheet and instruction to access the V-Safe system.   [name] was instructed to call 911 with any severe reactions post vaccine: Marland Kitchen Difficulty breathing  . Swelling of your face and throat  . A fast heartbeat  . A bad rash all over your body  . Dizziness and weakness    Immunizations Administered    Name Date Dose VIS Date Route   Moderna COVID-19 Vaccine 01/08/2020  2:14 PM 0.5 mL 10/17/2019 Intramuscular   Manufacturer: Moderna   Lot: [id number]   NDC: 52174-715-95

## 2020-01-10 ENCOUNTER — Ambulatory Visit: Payer: Self-pay | Admitting: Internal Medicine

## 2020-01-31 ENCOUNTER — Other Ambulatory Visit: Payer: Self-pay

## 2020-01-31 ENCOUNTER — Encounter: Payer: Self-pay | Admitting: Internal Medicine

## 2020-01-31 ENCOUNTER — Ambulatory Visit (INDEPENDENT_AMBULATORY_CARE_PROVIDER_SITE_OTHER): Payer: Self-pay | Admitting: Internal Medicine

## 2020-01-31 VITALS — BP 120/76 | HR 82 | Temp 97.5°F | Wt 175.0 lb

## 2020-01-31 DIAGNOSIS — E1142 Type 2 diabetes mellitus with diabetic polyneuropathy: Secondary | ICD-10-CM

## 2020-01-31 LAB — POCT GLYCOSYLATED HEMOGLOBIN (HGB A1C): Hemoglobin A1C: 10.5 % — AB (ref 4.0–5.6)

## 2020-01-31 NOTE — Assessment & Plan Note (Addendum)
Lab Results  Component Value Date   HGBA1C 10.5 (A) 01/31/2020   Some better adding the actos--but still way out of control Will proceed with endocrine referral Continue glipizide/metformin also for now Finances still an issue---at least he will get Part B soon--but doesn't have D Neuropathy seems better

## 2020-01-31 NOTE — Progress Notes (Signed)
Subjective:    Patient ID: Douglas [name], male    DOB: 1946-02-12, 74 y.o.   MRN: [id number]  HPI Here for follow up of diabetes that is poorly controlled This visit occurred during the SARS-CoV-2 public health emergency.  Safety protocols were in place, including screening questions prior to the visit, additional usage of staff PPE, and extensive cleaning of exam room while observing appropriate contact time as indicated for disinfecting solutions.   He continues to feel well Weight is stable Tries to limit sweets and carbs He doesn't check sugars No foot pain or burning--better with changing shoes  No chest pain  No SOB  Current Outpatient Medications on File Prior to Visit  Medication Sig Dispense Refill  . atorvastatin (LIPITOR) 40 MG tablet Take 1 tablet by mouth daily 90 tablet 0  . BAYER MICROLET LANCETS lancets Use as instructed to test blood sugar once daily dx:E11.65 100 each 3  . fluconazole (DIFLUCAN) 100 MG tablet Take 1 tablet by mouth once daily 7 tablet 0  . glipiZIDE (GLUCOTROL) 10 MG tablet Take 1 tablet (10 mg total) by mouth 2 (two) times daily before a meal. 180 tablet 3  . glucose blood test strip Use as instructed to test blood sugar once daily dx: E11.65 100 each 3  . metFORMIN (GLUCOPHAGE) 1000 MG tablet Take 1 tablet (1,000 mg total) by mouth 2 (two) times daily with a meal. 180 tablet 3  . pioglitazone (ACTOS) 30 MG tablet Take 1 tablet (30 mg total) by mouth daily. 90 tablet 3   No current facility-administered medications on file prior to visit.    No Known Allergies  Past Medical History:  Diagnosis Date  . Allergic rhinitis due to pollen   . BPH (benign prostatic hypertrophy)   . ED (erectile dysfunction)   . GERD (gastroesophageal reflux disease)   . Hyperlipidemia   . Type II or unspecified type diabetes mellitus without mention of complication, not stated as uncontrolled     Past Surgical History:  Procedure Laterality Date  . KIDNEY  STONE SURGERY  ~1990  . WRIST SURGERY  1970's    Family History  Problem Relation Age of Onset  . Cancer Mother   . Diabetes Daughter     Social History   Socioeconomic History  . Marital status: Married    Spouse name: Not on file  . Number of children: 3  . Years of education: Not on file  . Highest education level: Not on file  Occupational History  . Occupation: Public affairs consultant: [email or web address]    Comment: Retired  . Occupation: Curator    Comment: work on side  Tobacco Use  . Smoking status: Never Smoker  . Smokeless tobacco: Never Used  Substance and Sexual Activity  . Alcohol use: No  . Drug use: No  . Sexual activity: Not on file  Other Topics Concern  . Not on file  Social History Narrative   Divorced---then remarried 2016   3 children and 1 step son      No living will.   Wife to be health care POA   Would want resuscitation attempts   Wouldn't want tube feeds if cognitively unaware   Social Determinants of Health   Financial Resource Strain:   . Difficulty of Paying Living Expenses:   Food Insecurity:   . Worried About Programme researcher, broadcasting/film/video in the Last Year:   . [employer] of Food in the Last Year:  Transportation Needs:   . Film/video editor (Medical):   Marland Kitchen Lack of Transportation (Non-Medical):   Physical Activity:   . Days of Exercise per Week:   . Minutes of Exercise per Session:   Stress:   . Feeling of Stress :   Social Connections:   . Frequency of Communication with Friends and Family:   . Frequency of Social Gatherings with Friends and Family:   . Attends Religious Services:   . Active Member of Clubs or Organizations:   . Attends Archivist Meetings:   Marland Kitchen Marital Status:   Intimate Partner Violence:   . Fear of Current or Ex-Partner:   . Emotionally Abused:   Marland Kitchen Physically Abused:   . Sexually Abused:    Review of Systems Sleeps okay Appetite is good Should have Medicare Part B soon    Objective:   Physical Exam   Constitutional: No distress.  Neck: No thyromegaly present.  Cardiovascular: Normal rate, regular rhythm and intact distal pulses. Exam reveals no gallop.  No murmur heard. Respiratory: Effort normal and breath sounds normal. No respiratory distress. He has no wheezes. He has no rales.  Musculoskeletal:        General: No edema.  Lymphadenopathy:    He has no cervical adenopathy.  Neurological:  Fairly normal sensation in feet  Skin:  No foot lesions  Psychiatric: He has a normal mood and affect. His behavior is normal.           Assessment & Plan:

## 2020-02-13 ENCOUNTER — Ambulatory Visit: Payer: Self-pay | Attending: Family

## 2020-02-13 DIAGNOSIS — Z23 Encounter for immunization: Secondary | ICD-10-CM

## 2020-02-13 NOTE — Progress Notes (Signed)
   Covid-19 Vaccination Clinic  Name:  Douglas [name]    MRN: [id number] DOB: Jun 01, 1946  02/13/2020  Douglas [name] was observed post Covid-19 immunization for 15 minutes without incident. He was provided with Vaccine Information Sheet and instruction to access the V-Safe system.   Douglas [name] was instructed to call 911 with any severe reactions post vaccine: Marland Kitchen Difficulty breathing  . Swelling of face and throat  . A fast heartbeat  . A bad rash all over body  . Dizziness and weakness   Immunizations Administered    Name Date Dose VIS Date Route   Moderna COVID-19 Vaccine 02/13/2020  3:27 PM 0.5 mL 10/17/2019 Intramuscular   Manufacturer: Moderna   Lot: [id number]   NDC: 80881-103-15

## 2020-02-27 ENCOUNTER — Other Ambulatory Visit: Payer: Self-pay

## 2020-02-28 NOTE — Progress Notes (Signed)
Patient ID: Douglas [name], male   DOB: [birth year], 74 y.o.   MRN: [id number]   This visit occurred during the SARS-CoV-2 public health emergency.  Safety protocols were in place, including screening questions prior to the visit, additional usage of staff PPE, and extensive cleaning of exam room while observing appropriate contact time as indicated for disinfecting solutions.   HPI: Douglas [name] is a 74 y.o.-year-old male, referred by his PCP, Dr. Alphonsus Sias, for management of DM2, dx in ~2010, non-insulin-dependent, uncontrolled, with complications (peripheral neuropathy, ED). No insurance.  Reviewed HbA1c: Lab Results  Component Value Date   HGBA1C 10.5 (A) 01/31/2020   HGBA1C 11.3 (A) 10/06/2019   HGBA1C 11.4 (H) 03/31/2019   HGBA1C 10.0 (A) 09/30/2018   HGBA1C 12.8 (A) 07/01/2018   HGBA1C 10.7 (H) 09/01/2017   HGBA1C 7.2 04/20/2016   HGBA1C 6.9 01/15/2016   HGBA1C 6.9 10/16/2015   HGBA1C 6.8 07/15/2015   HGBA1C 6.9 (H) 04/16/2015   HGBA1C 11.6 (H) 12/11/2014   HGBA1C 10.8 (H) 06/08/2014   HGBA1C 11.6 (H) 07/27/2013   HGBA1C 7.2 (H) 10/18/2009   Pt is on a regimen of: - Metformin 1000 mg 2x a day, with meals >> 1000 mg in am 2/2 diarrhea - Glipizide 10 mg 2x a day before meals - Actos 30 mg daily He tried Invokana >> yeast infections, increased urination, dizziness >> would not want to restart it.  Pt is not checking sugars.  - am: n/c - 2h after b'fast: n/c - before lunch: n/c - 2h after lunch: n/c - before dinner: n/c - 2h after dinner: n/c - bedtime: n/c - nighttime: n/c Lowest sugar was ?; ?  At which level he has hypoglycemia awareness.  Highest sugar was ?.  Glucometer: Onetouch   Pt's meals are: - Breakfast: coffee; scrambled eggs + sausage or bacon; bisquit from [employer] w/o bread - Lunch: skip; banana - Dinner: baked chicken (prev. Fried) or cubed steak and gravy; kielbasa; beans; greens - Snacks: peanuts  - no CKD, last BUN/creatinine:  Lab Results   Component Value Date   BUN 17 03/31/2019   BUN 14 09/01/2017   CREATININE 1.02 03/31/2019   CREATININE 0.98 09/01/2017  Not on ACE inhibitor/ARB  -+ HL; last set of lipids: Lab Results  Component Value Date   CHOL 212 (H) 03/31/2019   HDL 42.00 03/31/2019   LDLCALC 139 (H) 03/31/2019   LDLDIRECT 173.0 02/13/2016   TRIG 158.0 (H) 03/31/2019   CHOLHDL 5 03/31/2019  On Lipitor 40.  - last eye exam was in 02/2020: No DR. + mild cataract.  - no numbness and tingling in his feet.  Pt has FH of DM in daughter  ROS: Constitutional: no weight gain, no weight loss, no fatigue, no subjective hyperthermia, no subjective hypothermia, no nocturia Eyes: no blurry vision, no xerophthalmia ENT: no sore throat, no nodules palpated in neck, no dysphagia, no odynophagia, no hoarseness, no tinnitus, no hypoacusis Cardiovascular: no CP, no SOB, no palpitations, no leg swelling Respiratory: no cough, no SOB, no wheezing Gastrointestinal: no N, no V, no D, no C, no acid reflux Musculoskeletal: no muscle, no joint aches Skin: no rash, no hair loss Neurological: no tremors, no numbness or tingling/no dizziness/no HAs Psychiatric: no depression, no anxiety  Past Medical History:  Diagnosis Date  . Allergic rhinitis due to pollen   . BPH (benign prostatic hypertrophy)   . ED (erectile dysfunction)   . GERD (gastroesophageal reflux disease)   . Hyperlipidemia   .  Type II or unspecified type diabetes mellitus without mention of complication, not stated as uncontrolled    Past Surgical History:  Procedure Laterality Date  . KIDNEY STONE SURGERY  ~1990  . WRIST SURGERY  1970's   Social History   Socioeconomic History  . Marital status: Married    Spouse name: Not on file  . Number of children: 3  . Years of education: Not on file  . Highest education level: Not on file  Occupational History  . Occupation: Barrister's clerk: [id number]    Comment: Retired  . Occupation: Dealer     Comment: work on side  Tobacco Use  . Smoking status: Never Smoker  . Smokeless tobacco: Never Used  Substance and Sexual Activity  . Alcohol use: No  . Drug use: No  . Sexual activity: Not on file  Other Topics Concern  . Not on file  Social History Narrative   Divorced---then remarried 2016   3 children and 1 step son      No living will.   Wife to be health care POA   Would want resuscitation attempts   Wouldn't want tube feeds if cognitively unaware   Social Determinants of Health   Financial Resource Strain:   . Difficulty of Paying Living Expenses:   Food Insecurity:   . Worried About Charity fundraiser in the Last Year:   . Arboriculturist in the Last Year:   Transportation Needs:   . Film/video editor (Medical):   Marland Kitchen Lack of Transportation (Non-Medical):   Physical Activity:   . Days of Exercise per Week:   . Minutes of Exercise per Session:   Stress:   . Feeling of Stress :   Social Connections:   . Frequency of Communication with Friends and Family:   . Frequency of Social Gatherings with Friends and Family:   . Attends Religious Services:   . Active Member of Clubs or Organizations:   . Attends Archivist Meetings:   Marland Kitchen Marital Status:   Intimate Partner Violence:   . Fear of Current or Ex-Partner:   . Emotionally Abused:   Marland Kitchen Physically Abused:   . Sexually Abused:    Current Outpatient Medications on File Prior to Visit  Medication Sig Dispense Refill  . atorvastatin (LIPITOR) 40 MG tablet Take 1 tablet by mouth daily 90 tablet 0  . BAYER MICROLET LANCETS lancets Use as instructed to test blood sugar once daily dx:E11.65 100 each 3  . glipiZIDE (GLUCOTROL) 10 MG tablet Take 1 tablet (10 mg total) by mouth 2 (two) times daily before a meal. 180 tablet 3  . glucose blood test strip Use as instructed to test blood sugar once daily dx: E11.65 100 each 3  . metFORMIN (GLUCOPHAGE) 1000 MG tablet Take 1 tablet (1,000 mg total) by mouth 2 (two)  times daily with a meal. 180 tablet 3  . pioglitazone (ACTOS) 30 MG tablet Take 1 tablet (30 mg total) by mouth daily. 90 tablet 3  . fluconazole (DIFLUCAN) 100 MG tablet Take 1 tablet by mouth once daily (Patient not taking: Reported on 02/29/2020) 7 tablet 0   No current facility-administered medications on file prior to visit.   No Known Allergies Family History  Problem Relation Age of Onset  . Cancer Mother   . Diabetes Daughter    PE: BP 130/82   Pulse 82   Ht 5\' 8"  (1.727 m)   Wt 170 lb (  77.1 kg)   SpO2 96%   BMI 25.85 kg/m  Wt Readings from Last 3 Encounters:  02/29/20 170 lb (77.1 kg)  01/31/20 175 lb (79.4 kg)  10/06/19 172 lb (78 kg)   Constitutional: overweight, in NAD Eyes: PERRLA, EOMI, no exophthalmos ENT: moist mucous membranes, no thyromegaly, no cervical lymphadenopathy Cardiovascular: RRR, No MRG Respiratory: CTA B Gastrointestinal: abdomen soft, NT, ND, BS+ Musculoskeletal: no deformities, strength intact in all 4 Skin: moist, warm, no rashes Neurological: no tremor with outstretched hands, DTR normal in all 4  ASSESSMENT: 1. DM2, non-insulin-dependent, uncontrolled, with complications - Peripheral neuropathy - ED  PLAN:  1. Patient with long-standing, uncontrolled diabetes, on oral antidiabetic regimen, which became insufficient.  Latest HbA1c was very high, at 10.5%, but it has been elevated since 2018. -At this visit, we reviewed his diet, which is high fat.  We discussed about the concept of insulin resistance and I explained the importance of reducing fatty foods.  I gave him examples of healthier meals.  I also gave him written instructions about salutary changes in his diet.  He is interested in starting to use these.  For now, we will schedule referral to nutrition but we may need to do this at next visit. -He also tells me at this visit that he gets frequent stools if he takes 2 Metformin doses a day and he mostly takes the morning dose only.  We  discussed about switching to Metformin ER and I advised him to take it twice a day with meals. -Also, he takes glipizide at or after meals and I advised him to move this 15 to 30 minutes before meals -However, I do not think the above changes will be enough and I suggested either addition of a GLP-1 receptor agonist or, if not, basal insulin.  He does not have insurance a GLP-1 receptor agonists are not an option.  He also would want to avoid injections if possible.  He would be interested in trying to work on his diet first and make the above medication changes for the next month and a half and then return for reevaluation.  We will proceed with this. - I suggested to:  Patient Instructions  Please change: - Metformin to ER and increase the dose 1000 mg 2x a day, with meals  Please take: - Glipizide 10 mg 2x a day before meals - Actos 30 mg daily  Please return in 1.5 months with your sugar log.   - Strongly advised him to start checking sugars at different times of the day - check 1-2x a day, rotating checks - discussed about CBG targets for treatment: 80-130 mg/dL before meals and <711 mg/dL after meals; target AFB9U <7%. - given sugar log and advised how to fill it and to bring it at next appt  - given foot care handout and explained the principles  - given instructions for hypoglycemia management "15-15 rule"  - advised for yearly eye exams  - Return to clinic in 3 mo with sugar log   Carlus Pavlov, MD PhD [facility] Endocrinology

## 2020-02-29 ENCOUNTER — Encounter: Payer: Self-pay | Admitting: Internal Medicine

## 2020-02-29 ENCOUNTER — Other Ambulatory Visit: Payer: Self-pay

## 2020-02-29 ENCOUNTER — Ambulatory Visit (INDEPENDENT_AMBULATORY_CARE_PROVIDER_SITE_OTHER): Payer: Self-pay | Admitting: Internal Medicine

## 2020-02-29 VITALS — BP 130/82 | HR 82 | Ht 68.0 in | Wt 170.0 lb

## 2020-02-29 DIAGNOSIS — E1142 Type 2 diabetes mellitus with diabetic polyneuropathy: Secondary | ICD-10-CM

## 2020-02-29 MED ORDER — METFORMIN HCL ER 500 MG PO TB24: 1000.0000 mg | ORAL_TABLET | Freq: Two times a day (BID) | ORAL | 3 refills | Status: DC

## 2020-02-29 NOTE — Patient Instructions (Addendum)
Please change: - Metformin to ER and increase the dose 1000 mg 2x a day, with meals  Please take: - Glipizide 10 mg 2x a day before meals - Actos 30 mg daily  Please return in 1.5 months with your sugar log.   PATIENT INSTRUCTIONS FOR TYPE 2 DIABETES:  **Please join MyChart!** - see attached instructions about how to join if you have not done so already.  DIET AND EXERCISE Diet and exercise is an important part of diabetic treatment.  We recommended aerobic exercise in the form of brisk walking (working between 40-60% of maximal aerobic capacity, similar to brisk walking) for 150 minutes per week (such as 30 minutes five days per week) along with 3 times per week performing 'resistance' training (using various gauge rubber tubes with handles) 5-10 exercises involving the major muscle groups (upper body, lower body and core) performing 10-15 repetitions (or near fatigue) each exercise. Start at half the above goal but build slowly to reach the above goals. If limited by weight, joint pain, or disability, we recommend daily walking in a swimming pool with water up to waist to reduce pressure from joints while allow for adequate exercise.    BLOOD GLUCOSES Monitoring your blood glucoses is important for continued management of your diabetes. Please check your blood glucoses 2-4 times a day: fasting, before meals and at bedtime (you can rotate these measurements - e.g. one day check before the 3 meals, the next day check before 2 of the meals and before bedtime, etc.).   HYPOGLYCEMIA (low blood sugar) Hypoglycemia is usually a reaction to not eating, exercising, or taking too much insulin/ other diabetes drugs.  Symptoms include tremors, sweating, hunger, confusion, headache, etc. Treat IMMEDIATELY with 15 grams of Carbs: . 4 glucose tablets .  cup regular juice/soda . 2 tablespoons raisins . 4 teaspoons sugar . 1 tablespoon honey Recheck blood glucose in 15 mins and repeat above if still  symptomatic/blood glucose <100.  RECOMMENDATIONS TO REDUCE YOUR RISK OF DIABETIC COMPLICATIONS: * Take your prescribed MEDICATION(S) * Follow a DIABETIC diet: Complex carbs, fiber rich foods, (monounsaturated and polyunsaturated) fats * AVOID saturated/trans fats, high fat foods, >2,300 mg salt per day. * EXERCISE at least 5 times a week for 30 minutes or preferably daily.  * DO NOT SMOKE OR DRINK more than 1 drink a day. * Check your FEET every day. Do not wear tightfitting shoes. Contact us if you develop an ulcer * See your EYE doctor once a year or more if needed * Get a FLU shot once a year * Get a PNEUMONIA vaccine once before and once after age 3 years  GOALS:  * Your Hemoglobin A1c of <7%  * fasting sugars need to be <130 * after meals sugars need to be <180 (2h after you start eating) * Your Systolic BP should be 140 or lower  * Your Diastolic BP should be 80 or lower  * Your HDL (Good Cholesterol) should be 40 or higher  * Your LDL (Bad Cholesterol) should be 100 or lower. * Your Triglycerides should be 150 or lower  * Your Urine microalbumin (kidney function) should be <30 * Your Body Mass Index should be 25 or lower    Please consider the following ways to cut down carbs and fat and increase fiber and micronutrients in your diet: - substitute whole grain for white bread or pasta - substitute brown rice for white rice - substitute 90-calorie flat bread pieces for slices of  bread when possible - substitute sweet potatoes or yams for white potatoes - substitute humus for margarine - substitute tofu for cheese when possible - substitute almond or rice milk for regular milk (would not drink soy milk daily due to concern for soy estrogen influence on breast cancer risk) - substitute dark chocolate for other sweets when possible - substitute water - can add lemon or orange slices for taste - for diet sodas (artificial sweeteners will trick your body that you can eat sweets  without getting calories and will lead you to overeating and weight gain in the long run) - do not skip breakfast or other meals (this will slow down the metabolism and will result in more weight gain over time)  - can try smoothies made from fruit and almond/rice milk in am instead of regular breakfast - can also try old-fashioned (not instant) oatmeal made with almond/rice milk in am - order the dressing on the side when eating salad at a restaurant (pour less than half of the dressing on the salad) - eat as little meat as possible - can try juicing, but should not forget that juicing will get rid of the fiber, so would alternate with eating raw veg./fruits or drinking smoothies - use as little oil as possible, even when using olive oil - can dress a salad with a mix of balsamic vinegar and lemon juice, for e.g. - use agave nectar, stevia sugar, or regular sugar rather than artificial sweateners - steam or broil/roast veggies  - snack on veggies/fruit/nuts (unsalted, preferably) when possible, rather than processed foods - reduce or eliminate aspartame in diet (it is in diet sodas, chewing gum, etc) Read the labels!  Try to read Dr. Janene Harvey book: "Program for Reversing Diabetes" for other ideas for healthy eating.

## 2020-04-16 ENCOUNTER — Other Ambulatory Visit: Payer: Self-pay

## 2020-04-16 ENCOUNTER — Ambulatory Visit (INDEPENDENT_AMBULATORY_CARE_PROVIDER_SITE_OTHER): Payer: Self-pay | Admitting: Internal Medicine

## 2020-04-16 ENCOUNTER — Encounter: Payer: Self-pay | Admitting: Internal Medicine

## 2020-04-16 VITALS — BP 130/88 | HR 89 | Ht 68.0 in | Wt 170.0 lb

## 2020-04-16 DIAGNOSIS — E1142 Type 2 diabetes mellitus with diabetic polyneuropathy: Secondary | ICD-10-CM

## 2020-04-16 DIAGNOSIS — E785 Hyperlipidemia, unspecified: Secondary | ICD-10-CM

## 2020-04-16 LAB — POCT GLYCOSYLATED HEMOGLOBIN (HGB A1C): Hemoglobin A1C: 8.9 % — AB (ref 4.0–5.6)

## 2020-04-16 LAB — GLUCOSE, POCT (MANUAL RESULT ENTRY): POC Glucose: 122 mg/dl — AB (ref 70–99)

## 2020-04-16 NOTE — Progress Notes (Addendum)
Patient ID: Douglas [name], male   DOB: 11/29/1945, 74 y.o.   MRN: [id number]   This visit occurred during the SARS-CoV-2 public health emergency.  Safety protocols were in place, including screening questions prior to the visit, additional usage of staff PPE, and extensive cleaning of exam room while observing appropriate contact time as indicated for disinfecting solutions.   HPI: Douglas [name] is a 74 y.o.-year-old male, returning for follow-up forDM2, dx in ~2010, non-insulin-dependent, uncontrolled, with complications (peripheral neuropathy, ED).  Last visit 1.5 months ago. No insurance.  Reviewed HbA1c levels: Lab Results  Component Value Date   HGBA1C 10.5 (A) 01/31/2020   HGBA1C 11.3 (A) 10/06/2019   HGBA1C 11.4 (H) 03/31/2019   HGBA1C 10.0 (A) 09/30/2018   HGBA1C 12.8 (A) 07/01/2018   HGBA1C 10.7 (H) 09/01/2017   HGBA1C 7.2 04/20/2016   HGBA1C 6.9 01/15/2016   HGBA1C 6.9 10/16/2015   HGBA1C 6.8 07/15/2015   HGBA1C 6.9 (H) 04/16/2015   HGBA1C 11.6 (H) 12/11/2014   HGBA1C 10.8 (H) 06/08/2014   HGBA1C 11.6 (H) 07/27/2013   HGBA1C 7.2 (H) 10/18/2009   Pt is on a regimen of: - Metformin 1000 mg 2x a day, with meals >> 1000 mg in am 2/2 diarrhea  >> Metformin ER 1000 mg 2x a day - Glipizide 10 mg 2x a day before meals - Actos 30 mg daily He tried Invokana >> yeast infections, increased urination, dizziness >> would not want to restart it.  He was not checking sugars at last visit. Still not checking sugars but he will start now. - am: n/c - 2h after b'fast: n/c - before lunch: n/c - 2h after lunch: n/c - before dinner: n/c - 2h after dinner: n/c - bedtime: n/c - nighttime: n/c Lowest sugar was ?;  It is unclear at which level he has hypoglycemia awareness. Highest sugar was ?.  Glucometer: Onetouch   Pt's meals are: - Breakfast: coffee; scrambled eggs + sausage or bacon; bisquit from [employer] w/o bread >> oatmeal - Lunch: skip; banana - Dinner: baked chicken (prev.  Fried) or cubed steak and gravy; kielbasa; beans; greens >> baked food, veggies, beans - Snacks: peanuts  -No CKD, last BUN/creatinine:  Lab Results  Component Value Date   BUN 17 03/31/2019   BUN 14 09/01/2017   CREATININE 1.02 03/31/2019   CREATININE 0.98 09/01/2017  Not on ACE inhibitor/ARB.  -+ HL; last set of lipids: Lab Results  Component Value Date   CHOL 212 (H) 03/31/2019   HDL 42.00 03/31/2019   LDLCALC 139 (H) 03/31/2019   LDLDIRECT 173.0 02/13/2016   TRIG 158.0 (H) 03/31/2019   CHOLHDL 5 03/31/2019  On Lipitor 40.  - last eye exam was in 02/2020: No DR, + mild cataract  - no numbness and tingling in his feet.  Pt has FH of DM in daughter  ROS: Constitutional: no weight gain/no weight loss, no fatigue, no subjective hyperthermia, no subjective hypothermia Eyes: no blurry vision, no xerophthalmia ENT: no sore throat, no nodules palpated in neck, no dysphagia, no odynophagia, no hoarseness Cardiovascular: no CP/no SOB/no palpitations/no leg swelling Respiratory: no cough/no SOB/no wheezing Gastrointestinal: no N/no V/no D/no C/no acid reflux Musculoskeletal: no muscle aches/no joint aches Skin: no rashes, no hair loss Neurological: no tremors/no numbness/no tingling/no dizziness  I reviewed pt's medications, allergies, PMH, social hx, family hx, and changes were documented in the history of present illness. Otherwise, unchanged from my initial visit note.  Past Medical History:  Diagnosis Date  .  Allergic rhinitis due to pollen   . BPH (benign prostatic hypertrophy)   . ED (erectile dysfunction)   . GERD (gastroesophageal reflux disease)   . Hyperlipidemia   . Type II or unspecified type diabetes mellitus without mention of complication, not stated as uncontrolled    Past Surgical History:  Procedure Laterality Date  . KIDNEY STONE SURGERY  ~1990  . WRIST SURGERY  1970's   Social History   Socioeconomic History  . Marital status: Married    Spouse  name: Not on file  . Number of children: 3  . Years of education: Not on file  . Highest education level: Not on file  Occupational History  . Occupation: Public affairs consultant: [email or web address]    Comment: Retired  . Occupation: Curator    Comment: work on side  Tobacco Use  . Smoking status: Never Smoker  . Smokeless tobacco: Never Used  Substance and Sexual Activity  . Alcohol use: No  . Drug use: No  . Sexual activity: Not on file  Other Topics Concern  . Not on file  Social History Narrative   Divorced---then remarried 2016   3 children and 1 step son      No living will.   Wife to be health care POA   Would want resuscitation attempts   Wouldn't want tube feeds if cognitively unaware   Social Determinants of Health   Financial Resource Strain:   . Difficulty of Paying Living Expenses:   Food Insecurity:   . Worried About Programme researcher, broadcasting/film/video in the Last Year:   . Barista in the Last Year:   Transportation Needs:   . Freight forwarder (Medical):   Marland Kitchen Lack of Transportation (Non-Medical):   Physical Activity:   . Days of Exercise per Week:   . Minutes of Exercise per Session:   Stress:   . Feeling of Stress :   Social Connections:   . Frequency of Communication with Friends and Family:   . Frequency of Social Gatherings with Friends and Family:   . Attends Religious Services:   . Active Member of Clubs or Organizations:   . Attends Banker Meetings:   Marland Kitchen Marital Status:   Intimate Partner Violence:   . Fear of Current or Ex-Partner:   . Emotionally Abused:   Marland Kitchen Physically Abused:   . Sexually Abused:    Current Outpatient Medications on File Prior to Visit  Medication Sig Dispense Refill  . atorvastatin (LIPITOR) 40 MG tablet Take 1 tablet by mouth daily 90 tablet 0  . BAYER MICROLET LANCETS lancets Use as instructed to test blood sugar once daily dx:E11.65 100 each 3  . fluconazole (DIFLUCAN) 100 MG tablet Take 1 tablet by mouth once  daily (Patient not taking: Reported on 02/29/2020) 7 tablet 0  . glipiZIDE (GLUCOTROL) 10 MG tablet Take 1 tablet (10 mg total) by mouth 2 (two) times daily before a meal. 180 tablet 3  . glucose blood test strip Use as instructed to test blood sugar once daily dx: E11.65 100 each 3  . metFORMIN (GLUCOPHAGE-XR) 500 MG 24 hr tablet Take 2 tablets (1,000 mg total) by mouth 2 (two) times daily with a meal. 360 tablet 3  . pioglitazone (ACTOS) 30 MG tablet Take 1 tablet (30 mg total) by mouth daily. 90 tablet 3   No current facility-administered medications on file prior to visit.   No Known Allergies Family History  Problem Relation  Age of Onset  . Cancer Mother   . Diabetes Daughter    PE: BP 130/88   Pulse 89   Ht 5\' 8"  (1.727 m)   Wt 170 lb (77.1 kg)   SpO2 96%   BMI 25.85 kg/m  Wt Readings from Last 3 Encounters:  04/16/20 170 lb (77.1 kg)  02/29/20 170 lb (77.1 kg)  01/31/20 175 lb (79.4 kg)   Constitutional: overweight, in NAD Eyes: PERRLA, EOMI, no exophthalmos ENT: moist mucous membranes, no thyromegaly, no cervical lymphadenopathy Cardiovascular: RRR, No MRG Respiratory: CTA B Gastrointestinal: abdomen soft, NT, ND, BS+ Musculoskeletal: no deformities, strength intact in all 4 Skin: moist, warm, no rashes Neurological: no tremor with outstretched hands, DTR normal in all 4  ASSESSMENT: 1. DM2, non-insulin-dependent, uncontrolled, with complications - Peripheral neuropathy - ED  PLAN:  1. Patient with longstanding, uncontrolled, type 2 diabetes, on oral antidiabetic regimen with Metformin,  Sulfonylurea, and TZD.  Before last visit, HbA1c was very high, at 10.5%, but it has been elevated since 2018.  At that time, we reviewed his diet and I recommended a lower fat diet.  We discussed about the concept of insulin resistance and explained the importance of improving meals.  At that time, he had a hard time tolerating Metformin and will switch to the extended release  Metformin and advised him to increase the dose if possible.  We also moved his glipizide 15 to 30 minutes before meals, as he was taking them after after meals.  I could not prescribe a GLP-1 receptor agonist as he did not have insurance.  He also wanted to avoid injections. - at this visit, he tells me he started to change his diet: no concentrated sweets, no white rice, no fried foods, lean meats if eating meat at all. Unfortunately, he is not checking sugars >> but brought his meter  - will start checking. Glu in the office: 122. Will continue current regimen for now.  - I suggested to:  Patient Instructions  Please continue: - Metformin ER 1000 mg 2x a day, with meals - Glipizide 10 mg 2x a day 15 min before meals - Actos 30 mg daily  Please return in 3 months with your sugar log.   - we checked his HbA1c: 8.9% (better). Glu in the office 122 with out meter,103 with his meter. - strongly advised him to check sugars at different times of the day - 1x a day, rotating check times - advised for yearly eye exams >> he is UTD - recheck annual labs today - return to clinic in 3 months  2. HL - Reviewed latest lipid panel: LDL above goal, but improved: Lab Results  Component Value Date   CHOL 212 (H) 03/31/2019   HDL 42.00 03/31/2019   LDLCALC 139 (H) 03/31/2019   LDLDIRECT 173.0 02/13/2016   TRIG 158.0 (H) 03/31/2019   CHOLHDL 5 03/31/2019  - Continues Lipitor without side effects. - recheck Lipid panel today  Component     Latest Ref Rng & Units 04/16/2020  Sodium     135 - 145 mEq/L 138  Potassium     3.5 - 5.1 mEq/L 4.1  Chloride     96 - 112 mEq/L 105  CO2     19 - 32 mEq/L 28  Glucose     70 - 99 mg/dL 06/16/2020 (H)  BUN     6 - 23 mg/dL 15  Creatinine     703 - 1.50 mg/dL 5.00  Total Bilirubin     0.2 - 1.2 mg/dL 0.3  Alkaline Phosphatase     39 - 117 U/L 57  AST     0 - 37 U/L 18  ALT     0 - 53 U/L 29  Total Protein     6.0 - 8.3 g/dL 7.0  Albumin     3.5 - 5.2  g/dL 4.3  Calcium     8.4 - 10.5 mg/dL 9.5  GFR     >10.25 mL/min 94.15  Cholesterol     0 - 200 mg/dL 852  Triglycerides     0.0 - 149.0 mg/dL 77.8  HDL Cholesterol     >39.00 mg/dL 24.23  VLDL     0.0 - 53.6 mg/dL 14.4  LDL (calc)     0 - 99 mg/dL 83  Total CHOL/HDL Ratio      3  NonHDL      95.35  Microalb, Ur     0.0 - 1.9 mg/dL 8.2 (H)  Creatinine,U     mg/dL 315.4  MICROALB/CREAT RATIO     0.0 - 30.0 mg/g 6.5  Hemoglobin A1C     4.0 - 5.6 % 8.9 (A)  POC Glucose     70 - 99 mg/dl 008 (A)   MUCH improved labs!  Carlus Pavlov, MD PhD [facility] Endocrin[facility]

## 2020-04-16 NOTE — Addendum Note (Signed)
Addended by: Darliss Ridgel I on: 04/16/2020 04:07 PM   Modules accepted: Orders

## 2020-04-16 NOTE — Patient Instructions (Addendum)
Please continue: - Metformin ER 1000 mg 2x a day, with meals - Glipizide 10 mg 2x a day 15 min before meals - Actos 30 mg daily  Start checking sugars 1x a day, rotating check times.  Please return in 3 months with your sugar log.

## 2020-04-17 LAB — COMPREHENSIVE METABOLIC PANEL
ALT: 29 U/L (ref 0–53)
AST: 18 U/L (ref 0–37)
Albumin: 4.3 g/dL (ref 3.5–5.2)
Alkaline Phosphatase: 57 U/L (ref 39–117)
BUN: 15 mg/dL (ref 6–23)
CO2: 28 mEq/L (ref 19–32)
Calcium: 9.5 mg/dL (ref 8.4–10.5)
Chloride: 105 mEq/L (ref 96–112)
Creatinine, Ser: 0.95 mg/dL (ref 0.40–1.50)
GFR: 94.15 mL/min (ref >60.00)
Glucose, Bld: 109 mg/dL — ABNORMAL HIGH (ref 70–99)
Potassium: 4.1 mEq/L (ref 3.5–5.1)
Sodium: 138 mEq/L (ref 135–145)
Total Bilirubin: 0.3 mg/dL (ref 0.2–1.2)
Total Protein: 7 g/dL (ref 6.0–8.3)

## 2020-04-17 LAB — LIPID PANEL
Cholesterol: 148 mg/dL (ref 0–200)
HDL: 53.1 mg/dL (ref >39.00)
LDL Cholesterol: 83 mg/dL (ref 0–99)
NonHDL: 95.35
Total CHOL/HDL Ratio: 3
Triglycerides: 63 mg/dL (ref 0.0–149.0)
VLDL: 12.6 mg/dL (ref 0.0–40.0)

## 2020-04-17 LAB — MICROALBUMIN / CREATININE URINE RATIO
Creatinine,U: 124.6 mg/dL
Microalb Creat Ratio: 6.5 mg/g (ref 0.0–30.0)
Microalb, Ur: 8.2 mg/dL — ABNORMAL HIGH (ref 0.0–1.9)

## 2020-06-19 ENCOUNTER — Other Ambulatory Visit: Payer: Self-pay | Admitting: Internal Medicine

## 2020-07-24 ENCOUNTER — Encounter: Payer: Self-pay | Admitting: Internal Medicine

## 2020-07-24 ENCOUNTER — Other Ambulatory Visit: Payer: Self-pay

## 2020-07-24 ENCOUNTER — Ambulatory Visit: Payer: Self-pay | Admitting: Internal Medicine

## 2020-07-24 VITALS — BP 118/80 | HR 74 | Temp 97.8°F | Ht 66.0 in | Wt 172.0 lb

## 2020-07-24 DIAGNOSIS — E785 Hyperlipidemia, unspecified: Secondary | ICD-10-CM

## 2020-07-24 DIAGNOSIS — Z1211 Encounter for screening for malignant neoplasm of colon: Secondary | ICD-10-CM

## 2020-07-24 DIAGNOSIS — Z0000 Encounter for general adult medical examination without abnormal findings: Secondary | ICD-10-CM

## 2020-07-24 DIAGNOSIS — N401 Enlarged prostate with lower urinary tract symptoms: Secondary | ICD-10-CM

## 2020-07-24 DIAGNOSIS — K219 Gastro-esophageal reflux disease without esophagitis: Secondary | ICD-10-CM

## 2020-07-24 DIAGNOSIS — Z23 Encounter for immunization: Secondary | ICD-10-CM

## 2020-07-24 DIAGNOSIS — N138 Other obstructive and reflux uropathy: Secondary | ICD-10-CM

## 2020-07-24 DIAGNOSIS — E1142 Type 2 diabetes mellitus with diabetic polyneuropathy: Secondary | ICD-10-CM

## 2020-07-24 DIAGNOSIS — Z7189 Other specified counseling: Secondary | ICD-10-CM

## 2020-07-24 LAB — HM DIABETES FOOT EXAM

## 2020-07-24 NOTE — Progress Notes (Signed)
Subjective:    Patient ID: SHERMAINE BRIGHAM, male    DOB: 16-Jul-1946, 74 y.o.   MRN: [id number]  HPI Here for initial Medicare wellness visit This visit occurred during the SARS-CoV-2 public health emergency.  Safety protocols were in place, including screening questions prior to the visit, additional usage of staff PPE, and extensive cleaning of exam room while observing appropriate contact time as indicated for disinfecting solutions.   Reviewed advanced directives Reviewed other doctors--- Dr Rocco Serene, [place] best (opto) No surgery or hospitalizations in past year No tobacco or alcohol No regular exercise--but active with assembly work (and yard) No falls No depression or anhedonia Vision and hearing are okay Independent with instrumental ADLs No memory problems  Diabetes control is improved some Working with Dr Elvera Lennox Numbness/tingling in hands but not feet Some balance problems Working on lifestyle as well  Continues on statin No myalgias or GI problems No chest pain No dizziness or syncope No edema No palpitations No SOB  No recent problems with reflux---only certain foods which he avoids No dysphagia  Current Outpatient Medications on File Prior to Visit  Medication Sig Dispense Refill  . atorvastatin (LIPITOR) 40 MG tablet Take 1 tablet by mouth once daily 90 tablet 3  . BAYER MICROLET LANCETS lancets Use as instructed to test blood sugar once daily dx:E11.65 100 each 3  . glipiZIDE (GLUCOTROL) 10 MG tablet TAKE 1 TABLET BY MOUTH TWICE DAILY BEFORE  A  MEAL 180 tablet 3  . glucose blood test strip Use as instructed to test blood sugar once daily dx: E11.65 100 each 3  . metFORMIN (GLUCOPHAGE-XR) 500 MG 24 hr tablet Take 2 tablets (1,000 mg total) by mouth 2 (two) times daily with a meal. 360 tablet 3  . pioglitazone (ACTOS) 30 MG tablet Take 1 tablet (30 mg total) by mouth daily. 90 tablet 3   No current facility-administered medications on file  prior to visit.    No Known Allergies  Past Medical History:  Diagnosis Date  . Allergic rhinitis due to pollen   . BPH (benign prostatic hypertrophy)   . ED (erectile dysfunction)   . GERD (gastroesophageal reflux disease)   . Hyperlipidemia   . Type II or unspecified type diabetes mellitus without mention of complication, not stated as uncontrolled     Past Surgical History:  Procedure Laterality Date  . KIDNEY STONE SURGERY  ~1990  . WRIST SURGERY  1970's    Family History  Problem Relation Age of Onset  . Cancer Mother   . Diabetes Daughter     Social History   Socioeconomic History  . Marital status: Married    Spouse name: Not on file  . Number of children: 3  . Years of education: Not on file  . Highest education level: Not on file  Occupational History  . Occupation: Public affairs consultant: [email or web address]    Comment: Retired  . Occupation: Curator    Comment: work on side  Tobacco Use  . Smoking status: Never Smoker  . Smokeless tobacco: Never Used  Substance and Sexual Activity  . Alcohol use: No  . Drug use: No  . Sexual activity: Not on file  Other Topics Concern  . Not on file  Social History Narrative   Divorced---then remarried 2016   3 children and 1 step son      No living will.   Wife to be health care POA---alternate is daughter or son   Would want  resuscitation attempts   Wouldn't want tube feeds if cognitively unaware   Social Determinants of Health   Financial Resource Strain:   . Difficulty of Paying Living Expenses: Not on file  Food Insecurity:   . Worried About Programme researcher, broadcasting/film/video in the Last Year: Not on file  . Ran Out of Food in the Last Year: Not on file  Transportation Needs:   . Lack of Transportation (Medical): Not on file  . Lack of Transportation (Non-Medical): Not on file  Physical Activity:   . Days of Exercise per Week: Not on file  . Minutes of Exercise per Session: Not on file  Stress:   . Feeling of Stress :  Not on file  Social Connections:   . Frequency of Communication with Friends and Family: Not on file  . Frequency of Social Gatherings with Friends and Family: Not on file  . Attends Religious Services: Not on file  . Active Member of Clubs or Organizations: Not on file  . Attends Banker Meetings: Not on file  . Marital Status: Not on file  Intimate Partner Violence:   . Fear of Current or Ex-Partner: Not on file  . Emotionally Abused: Not on file  . Physically Abused: Not on file  . Sexually Abused: Not on file   Review of Systems Having some teeth problems---waiting on dentist with his new insurance (Medicare) Appetite is okay Weight stable Sleeps fairly well No rash or suspicious skin lesions Bowels are fine--no blood Voids well---good flow. Nocturia x 2-3.  No sig back or joint pains     Objective:   Physical Exam Constitutional:      Appearance: Normal appearance.  HENT:     Mouth/Throat:     Comments: No oral lesions Eyes:     Conjunctiva/sclera: Conjunctivae normal.     Pupils: Pupils are equal, round, and reactive to light.  Cardiovascular:     Rate and Rhythm: Normal rate and regular rhythm.     Pulses: Normal pulses.     Heart sounds: No murmur heard.  No gallop.   Pulmonary:     Effort: Pulmonary effort is normal.     Breath sounds: Normal breath sounds. No wheezing or rales.  Abdominal:     Palpations: Abdomen is soft.     Tenderness: There is no abdominal tenderness.  Musculoskeletal:     Cervical back: Neck supple.     Right lower leg: No edema.     Left lower leg: No edema.  Lymphadenopathy:     Cervical: No cervical adenopathy.  Skin:    General: Skin is warm.     Findings: No rash.     Comments: No foot lesions  Neurological:     Mental Status: He is alert and oriented to person, place, and time.     Comments: President--- "Duane Boston, Trump, Barack Obama" 100-93---trouble with numbers D-l-o-[id number] Recall 3/3 Normal sensation  (fine touch) in feet  Psychiatric:        Mood and Affect: Mood normal.        Behavior: Behavior normal.            Assessment & Plan:

## 2020-07-24 NOTE — Progress Notes (Addendum)
Hearing Screening   125Hz  250Hz  500Hz  1000Hz  2000Hz  3000Hz  4000Hz  6000Hz  8000Hz   Right ear:   20 20 20   40    Left ear:   20 20 20   40      Visual Acuity Screening   Right eye Left eye Both eyes  Without correction:     With correction: 20/15 20/15 20/13

## 2020-07-24 NOTE — Assessment & Plan Note (Signed)
Control is better now--- Lab Results  Component Value Date   HGBA1C 8.9 (A) 04/16/2020   On actos glipizide and metformin

## 2020-07-24 NOTE — Assessment & Plan Note (Signed)
Mostly nocturia but not bothering him If worsens, will start tamsulosin

## 2020-07-24 NOTE — Assessment & Plan Note (Signed)
Has been doing okay without medications

## 2020-07-24 NOTE — Assessment & Plan Note (Signed)
See social history 

## 2020-07-24 NOTE — Assessment & Plan Note (Signed)
I have personally reviewed the Medicare Annual Wellness questionnaire and have noted 1. The patient's medical and social history 2. Their use of alcohol, tobacco or illicit drugs 3. Their current medications and supplements 4. The patient's functional ability including ADL's, fall risks, home safety risks and hearing or visual             impairment. 5. Diet and physical activities 6. Evidence for depression or mood disorders  The patients weight, height, BMI and visual acuity have been recorded in the chart I have made referrals, counseling and provided education to the patient based review of the above and I have provided the pt with a written personalized care plan for preventive services.  I have provided you with a copy of your personalized plan for preventive services. Please take the time to review along with your updated medication list.  Fairly healthy--discussed fitness Flu vaccine today Consider shingrix at the pharmacy Discussed PSA--will hold off due to age  FIT COVID booster

## 2020-07-24 NOTE — Assessment & Plan Note (Signed)
On statin due to DM

## 2020-08-07 ENCOUNTER — Ambulatory Visit: Payer: Self-pay | Admitting: Internal Medicine

## 2020-08-07 NOTE — Progress Notes (Deleted)
Patient ID: Douglas [name], male   DOB: 1946/04/14, 74 y.o.   MRN: [id number]   This visit occurred during the SARS-CoV-2 public health emergency.  Safety protocols were in place, including screening questions prior to the visit, additional usage of staff PPE, and extensive cleaning of exam room while observing appropriate contact time as indicated for disinfecting solutions.   HPI: Douglas [name] is a 74 y.o.-year-old male, returning for follow-up forDM2, dx in ~2010, non-insulin-dependent, uncontrolled, with complications (peripheral neuropathy, ED).  Last visit 3.5 months ago. No insurance.  Reviewed HbA1c levels: Lab Results  Component Value Date   HGBA1C 8.9 (A) 04/16/2020   HGBA1C 10.5 (A) 01/31/2020   HGBA1C 11.3 (A) 10/06/2019   HGBA1C 11.4 (H) 03/31/2019   HGBA1C 10.0 (A) 09/30/2018   HGBA1C 12.8 (A) 07/01/2018   HGBA1C 10.7 (H) 09/01/2017   HGBA1C 7.2 04/20/2016   HGBA1C 6.9 01/15/2016   HGBA1C 6.9 10/16/2015   HGBA1C 6.8 07/15/2015   HGBA1C 6.9 (H) 04/16/2015   HGBA1C 11.6 (H) 12/11/2014   HGBA1C 10.8 (H) 06/08/2014   HGBA1C 11.6 (H) 07/27/2013   HGBA1C 7.2 (H) 10/18/2009   Pt is on a regimen of: - Metformin 1000 mg 2x a day, with meals >> 1000 mg in am 2/2 diarrhea  >> Metformin ER 1000 mg 2x a day - Glipizide 10 mg 2x a day before meals - Actos 30 mg daily He tried Invokana >> yeast infections, increased urination, dizziness >> would not want to restart it. Also refused injectables in the past.  He was not checking sugars at last visit.  Now checking once a day: - am: n/c - 2h after b'fast: n/c - before lunch: n/c - 2h after lunch: n/c - before dinner: n/c - 2h after dinner: n/c - bedtime: n/c - nighttime: n/c Lowest sugar was ?;  It is unclear at which level he has hypoglycemia awareness. Highest sugar was ?.  Glucometer: Onetouch   Pt's meals are: - Breakfast: coffee; scrambled eggs + sausage or bacon; bisquit from [employer] w/o bread >> oatmeal - Lunch:  skip; banana - Dinner: baked chicken (prev. Fried) or cubed steak and gravy; kielbasa; beans; greens >> baked food, veggies, beans - Snacks: peanuts  -No CKD, last BUN/creatinine:  Lab Results  Component Value Date   BUN 15 04/16/2020   BUN 17 03/31/2019   CREATININE 0.95 04/16/2020   CREATININE 1.02 03/31/2019  He is not on ACE inhibitor/ARB.  -+ HL; last set of lipids: Lab Results  Component Value Date   CHOL 148 04/16/2020   HDL 53.10 04/16/2020   LDLCALC 83 04/16/2020   LDLDIRECT 173.0 02/13/2016   TRIG 63.0 04/16/2020   CHOLHDL 3 04/16/2020  On Lipitor 40.  - last eye exam was in 02/2020: No DR, + mild cataract  - no numbness and tingling in his feet.  Pt has FH of DM in daughter  ROS: Constitutional: no weight gain/no weight loss, no fatigue, no subjective hyperthermia, no subjective hypothermia Eyes: no blurry vision, no xerophthalmia ENT: no sore throat, no nodules palpated in neck, no dysphagia, no odynophagia, no hoarseness Cardiovascular: no CP/no SOB/no palpitations/no leg swelling Respiratory: no cough/no SOB/no wheezing Gastrointestinal: no N/no V/no D/no C/no acid reflux Musculoskeletal: no muscle aches/no joint aches Skin: no rashes, no hair loss Neurological: no tremors/no numbness/no tingling/no dizziness  I reviewed pt's medications, allergies, PMH, social hx, family hx, and changes were documented in the history of present illness. Otherwise, unchanged from my initial visit  note.  Past Medical History:  Diagnosis Date  . Allergic rhinitis due to pollen   . BPH (benign prostatic hypertrophy)   . ED (erectile dysfunction)   . GERD (gastroesophageal reflux disease)   . Hyperlipidemia   . Type II or unspecified type diabetes mellitus without mention of complication, not stated as uncontrolled    Past Surgical History:  Procedure Laterality Date  . KIDNEY STONE SURGERY  ~1990  . WRIST SURGERY  1970's   Social History   Socioeconomic History   . Marital status: Married    Spouse name: Not on file  . Number of children: 3  . Years of education: Not on file  . Highest education level: Not on file  Occupational History  . Occupation: Public affairs consultant: [email or web address]    Comment: Retired  . Occupation: Curator    Comment: work on side  Tobacco Use  . Smoking status: Never Smoker  . Smokeless tobacco: Never Used  Substance and Sexual Activity  . Alcohol use: No  . Drug use: No  . Sexual activity: Not on file  Other Topics Concern  . Not on file  Social History Narrative   Divorced---then remarried 2016   3 children and 1 step son      No living will.   Wife to be health care POA---alternate is daughter or son   Would want resuscitation attempts   Wouldn't want tube feeds if cognitively unaware   Social Determinants of Health   Financial Resource Strain:   . Difficulty of Paying Living Expenses: Not on file  Food Insecurity:   . Worried About Programme researcher, broadcasting/film/video in the Last Year: Not on file  . Ran Out of Food in the Last Year: Not on file  Transportation Needs:   . Lack of Transportation (Medical): Not on file  . Lack of Transportation (Non-Medical): Not on file  Physical Activity:   . Days of Exercise per Week: Not on file  . Minutes of Exercise per Session: Not on file  Stress:   . Feeling of Stress : Not on file  Social Connections:   . Frequency of Communication with Friends and Family: Not on file  . Frequency of Social Gatherings with Friends and Family: Not on file  . Attends Religious Services: Not on file  . Active Member of Clubs or Organizations: Not on file  . Attends Banker Meetings: Not on file  . Marital Status: Not on file  Intimate Partner Violence:   . Fear of Current or Ex-Partner: Not on file  . Emotionally Abused: Not on file  . Physically Abused: Not on file  . Sexually Abused: Not on file   Current Outpatient Medications on File Prior to Visit  Medication Sig  Dispense Refill  . atorvastatin (LIPITOR) 40 MG tablet Take 1 tablet by mouth once daily 90 tablet 3  . BAYER MICROLET LANCETS lancets Use as instructed to test blood sugar once daily dx:E11.65 100 each 3  . glipiZIDE (GLUCOTROL) 10 MG tablet TAKE 1 TABLET BY MOUTH TWICE DAILY BEFORE  A  MEAL 180 tablet 3  . glucose blood test strip Use as instructed to test blood sugar once daily dx: E11.65 100 each 3  . metFORMIN (GLUCOPHAGE-XR) 500 MG 24 hr tablet Take 2 tablets (1,000 mg total) by mouth 2 (two) times daily with a meal. 360 tablet 3  . pioglitazone (ACTOS) 30 MG tablet Take 1 tablet (30 mg total) by mouth  daily. 90 tablet 3   No current facility-administered medications on file prior to visit.   No Known Allergies Family History  Problem Relation Age of Onset  . Cancer Mother   . Diabetes Daughter    PE: There were no vitals taken for this visit. Wt Readings from Last 3 Encounters:  07/24/20 172 lb (78 kg)  04/16/20 170 lb (77.1 kg)  02/29/20 170 lb (77.1 kg)   Constitutional: overweight, in NAD Eyes: PERRLA, EOMI, no exophthalmos ENT: moist mucous membranes, no thyromegaly, no cervical lymphadenopathy Cardiovascular: RRR, No MRG Respiratory: CTA B Gastrointestinal: abdomen soft, NT, ND, BS+ Musculoskeletal: no deformities, strength intact in all 4 Skin: moist, warm, no rashes Neurological: no tremor with outstretched hands, DTR normal in all 4  ASSESSMENT: 1. DM2, non-insulin-dependent, uncontrolled, with complications - Peripheral neuropathy - ED  PLAN:  1. Patient with longstanding, uncontrolled, type 2 diabetes, on oral antidiabetic regimen with Metformin, sulfonylurea and TZD.  His HbA1c has been very elevated since 2018.  We discussed about him proving diet and I recommended a lower fat diet.  We discussed about the concept of insulin resistance and explained the importance of improving meals.  We switched to the extended release Metformin as he was not tolerating  regular Metformin well.  We also increase the dose of Metformin and move glipizide 15 to 30 minutes before meals as he was taking them after meals.  He did not have insurance I could not prescribe a GLP-1 receptor agonist.  Also, he wanted to avoid injections.  At last visit, he started to change his diet: No concentrated sweets, no white rice, no fried foods, only lean meats if eating meat at all.  He did not check sugars, unfortunately, but his HbA1c was better, at 8.9%.  We did not change his regimen at that time.  - I suggested to:  Patient Instructions  Please continue: - Metformin ER 1000 mg 2x a day, with meals - Glipizide 10 mg 2x a day 15 min before meals - Actos 30 mg daily  Please return in 3 months with your sugar log.   - we checked his HbA1c: 7%  - advised to check sugars at different times of the day - 1x a day, rotating check times - advised for yearly eye exams >> he is UTD - return to clinic in 3 months  2. HL -Reviewed latest lipid panel from 04/2020: LDL much improved, slightly above goal of less than 70 Lab Results  Component Value Date   CHOL 148 04/16/2020   HDL 53.10 04/16/2020   LDLCALC 83 04/16/2020   LDLDIRECT 173.0 02/13/2016   TRIG 63.0 04/16/2020   CHOLHDL 3 04/16/2020  -Continues Lipitor without side effects   Carlus Pavlov, MD PhD [facility]

## 2020-08-08 ENCOUNTER — Encounter: Payer: Self-pay | Admitting: Internal Medicine

## 2020-08-08 ENCOUNTER — Ambulatory Visit (INDEPENDENT_AMBULATORY_CARE_PROVIDER_SITE_OTHER): Payer: Self-pay | Admitting: Internal Medicine

## 2020-08-08 ENCOUNTER — Other Ambulatory Visit: Payer: Self-pay

## 2020-08-08 VITALS — BP 148/102 | HR 91 | Ht 66.0 in | Wt 173.6 lb

## 2020-08-08 DIAGNOSIS — E1142 Type 2 diabetes mellitus with diabetic polyneuropathy: Secondary | ICD-10-CM

## 2020-08-08 DIAGNOSIS — E785 Hyperlipidemia, unspecified: Secondary | ICD-10-CM

## 2020-08-08 LAB — POCT GLYCOSYLATED HEMOGLOBIN (HGB A1C): Hemoglobin A1C: 8.3 % — AB (ref 4.0–5.6)

## 2020-08-08 NOTE — Progress Notes (Signed)
Patient ID: Douglas [name], male   DOB: October 23, 1946, 74 y.o.   MRN: [id number]   This visit occurred during the SARS-CoV-2 public health emergency.  Safety protocols were in place, including screening questions prior to the visit, additional usage of staff PPE, and extensive cleaning of exam room while observing appropriate contact time as indicated for disinfecting solutions.    HPI: Douglas [name] is a 74 y.o.-year-old male, returning for follow-up forDM2, dx in ~2010, non-insulin-dependent, uncontrolled, with complications (peripheral neuropathy, ED).  Last visit 3.5 months ago. No insurance.  Reviewed HbA1c levels: Lab Results  Component Value Date   HGBA1C 8.9 (A) 04/16/2020   HGBA1C 10.5 (A) 01/31/2020   HGBA1C 11.3 (A) 10/06/2019   HGBA1C 11.4 (H) 03/31/2019   HGBA1C 10.0 (A) 09/30/2018   HGBA1C 12.8 (A) 07/01/2018   HGBA1C 10.7 (H) 09/01/2017   HGBA1C 7.2 04/20/2016   HGBA1C 6.9 01/15/2016   HGBA1C 6.9 10/16/2015   HGBA1C 6.8 07/15/2015   HGBA1C 6.9 (H) 04/16/2015   HGBA1C 11.6 (H) 12/11/2014   HGBA1C 10.8 (H) 06/08/2014   HGBA1C 11.6 (H) 07/27/2013   HGBA1C 7.2 (H) 10/18/2009   Pt is on a regimen of: - Metformin 1000 mg 2x a day, with meals >> 1000 mg in am 2/2 diarrhea  >> Metformin ER 1000 mg twice a day - Glipizide 10 mg 2x a day before meals - Actos 30 mg daily He tried Invokana >> yeast infections, increased urination, dizziness >> would not want to restart it. Also refused injectables in the past.  He was not checking sugars at last visit. Still not checking... - am: n/c  - 2h after b'fast: n/c - before lunch: n/c - 2h after lunch: n/c - before dinner: n/c - 2h after dinner: n/c - bedtime: n/c - nighttime: n/c Lowest sugar was ?;  It is unclear at which level he has hypoglycemia awareness. Highest sugar was ?.  Glucometer: Onetouch   Pt's meals are: - Breakfast: coffee; scrambled eggs + sausage or bacon; bisquit from [employer] w/o bread >> oatmeal -  Lunch: skip; banana - Dinner: baked chicken (prev. Fried) or cubed steak and gravy; kielbasa; beans; greens >> baked food, veggies, beans - Snacks: peanuts  -No CKD, last BUN/creatinine:  Lab Results  Component Value Date   BUN 15 04/16/2020   BUN 17 03/31/2019   CREATININE 0.95 04/16/2020   CREATININE 1.02 03/31/2019  Is not on ACE inhibitor/ARB.  -+ HL; last set of lipids: Lab Results  Component Value Date   CHOL 148 04/16/2020   HDL 53.10 04/16/2020   LDLCALC 83 04/16/2020   LDLDIRECT 173.0 02/13/2016   TRIG 63.0 04/16/2020   CHOLHDL 3 04/16/2020  On Lipitor 40.  - last eye exam was in 02/2020: No DR, + mild cataract  - no numbness and tingling in his feet.  Pt has FH of DM in daughter  ROS: Constitutional: no weight gain/no weight loss, no fatigue, no subjective hyperthermia, no subjective hypothermia Eyes: no blurry vision, no xerophthalmia ENT: no sore throat, no nodules palpated in neck, no dysphagia, no odynophagia, no hoarseness Cardiovascular: no CP/no SOB/no palpitations/no leg swelling Respiratory: no cough/no SOB/no wheezing Gastrointestinal: no N/no V/no D/no C/no acid reflux Musculoskeletal: no muscle aches/no joint aches Skin: no rashes, no hair loss Neurological: no tremors/+hand  numbness  tingling/no dizziness  I reviewed pt's medications, allergies, PMH, social hx, family hx, and changes were documented in the history of present illness. Otherwise, unchanged from my initial visit  note.  Past Medical History:  Diagnosis Date  . Allergic rhinitis due to pollen   . BPH (benign prostatic hypertrophy)   . ED (erectile dysfunction)   . GERD (gastroesophageal reflux disease)   . Hyperlipidemia   . Type II or unspecified type diabetes mellitus without mention of complication, not stated as uncontrolled    Past Surgical History:  Procedure Laterality Date  . KIDNEY STONE SURGERY  ~1990  . WRIST SURGERY  1970's   Social History   Socioeconomic  History  . Marital status: Married    Spouse name: Not on file  . Number of children: 3  . Years of education: Not on file  . Highest education level: Not on file  Occupational History  . Occupation: Public affairs consultant: [email or web address]    Comment: Retired  . Occupation: Curator    Comment: work on side  Tobacco Use  . Smoking status: Never Smoker  . Smokeless tobacco: Never Used  Substance and Sexual Activity  . Alcohol use: No  . Drug use: No  . Sexual activity: Not on file  Other Topics Concern  . Not on file  Social History Narrative   Divorced---then remarried 2016   3 children and 1 step son      No living will.   Wife to be health care POA---alternate is daughter or son   Would want resuscitation attempts   Wouldn't want tube feeds if cognitively unaware   Social Determinants of Health   Financial Resource Strain:   . Difficulty of Paying Living Expenses: Not on file  Food Insecurity:   . Worried About Programme researcher, broadcasting/film/video in the Last Year: Not on file  . Ran Out of Food in the Last Year: Not on file  Transportation Needs:   . Lack of Transportation (Medical): Not on file  . Lack of Transportation (Non-Medical): Not on file  Physical Activity:   . Days of Exercise per Week: Not on file  . Minutes of Exercise per Session: Not on file  Stress:   . Feeling of Stress : Not on file  Social Connections:   . Frequency of Communication with Friends and Family: Not on file  . Frequency of Social Gatherings with Friends and Family: Not on file  . Attends Religious Services: Not on file  . Active Member of Clubs or Organizations: Not on file  . Attends Banker Meetings: Not on file  . Marital Status: Not on file  Intimate Partner Violence:   . Fear of Current or Ex-Partner: Not on file  . Emotionally Abused: Not on file  . Physically Abused: Not on file  . Sexually Abused: Not on file   Current Outpatient Medications on File Prior to Visit  Medication  Sig Dispense Refill  . atorvastatin (LIPITOR) 40 MG tablet Take 1 tablet by mouth once daily 90 tablet 3  . BAYER MICROLET LANCETS lancets Use as instructed to test blood sugar once daily dx:E11.65 100 each 3  . glipiZIDE (GLUCOTROL) 10 MG tablet TAKE 1 TABLET BY MOUTH TWICE DAILY BEFORE  A  MEAL 180 tablet 3  . glucose blood test strip Use as instructed to test blood sugar once daily dx: E11.65 100 each 3  . metFORMIN (GLUCOPHAGE-XR) 500 MG 24 hr tablet Take 2 tablets (1,000 mg total) by mouth 2 (two) times daily with a meal. 360 tablet 3  . pioglitazone (ACTOS) 30 MG tablet Take 1 tablet (30 mg total) by mouth  daily. 90 tablet 3   No current facility-administered medications on file prior to visit.   No Known Allergies Family History  Problem Relation Age of Onset  . Cancer Mother   . Diabetes Daughter    PE: BP (!) 148/102 (BP Location: Right Arm, Patient Position: Sitting, Cuff Size: Normal)   Pulse 91   Ht 5\' 6"  (1.676 m)   Wt 173 lb 9.6 oz (78.7 kg)   SpO2 96%   BMI 28.02 kg/m  Wt Readings from Last 3 Encounters:  08/08/20 173 lb 9.6 oz (78.7 kg)  07/24/20 172 lb (78 kg)  04/16/20 170 lb (77.1 kg)   Constitutional: overweight, in NAD Eyes: PERRLA, EOMI, no exophthalmos ENT: moist mucous membranes, no thyromegaly, no cervical lymphadenopathy Cardiovascular: RRR, No MRG Respiratory: CTA B Gastrointestinal: abdomen soft, NT, ND, BS+ Musculoskeletal: no deformities, strength intact in all 4 Skin: moist, warm, no rashes Neurological: no tremor with outstretched hands, DTR normal in all 4  ASSESSMENT: 1. DM2, non-insulin-dependent, uncontrolled, with complications - Peripheral neuropathy - ED  PLAN:  1. Patient with longstanding, uncontrolled, type 2 diabetes, on oral antidiabetic regimen with Metformin, sulfonylurea, and TZD.  His HbA1c has been very elevated since 2018.  We discussed about improving diet and I recommended a lower fat diet.  We discussed about the  concept of insulin resistance and explained the importance of improving meals.  We switched to the extended release Metformin as he did not tolerate regular Metformin well.  We also increased the dose of Metformin and moved the glipizide 15 to 30 minutes before meals as he was taking them after meals.  He did not have insurance so I could not prescribe a GLP-1 receptor agonist.  Also, he wanted to avoid injections.  At last visit, he started to change his diet: No concentrated sweets, no white rice, no fried foods, only lean meats if eating meat at all.  He did not check sugars at last visit, unfortunately, but his HbA1c was better, at 8.9%.  We did not change his regimen at that time but I strongly advised him to start checking sugars consistently. -At this visit, unfortunately, he is still not checking sugars at all.  We discussed that I cannot help him if he does not start to do so.  It is about for him to check once a day, rotating check times.  He agrees to start.  Given her sugar log and explained target ranges. - we checked his HbA1c: 8.3% (lower) -He does not complain of symptoms of hypoglycemia or hyperglycemia. -Unfortunately, without blood sugar checks, I cannot change his regimen at this time.   - I suggested to:  Patient Instructions  Please continue: - Metformin ER 1000 mg 2x a day, with meals - Glipizide 10 mg 2x a day 15 min before meals - Actos 30 mg daily  Please return in 3 months with your sugar log.   - advised for yearly eye exams >> he is UTD - recommended an over-the-counter supplement of 1000 mcg B12 for numbness/tingling in hands - return to clinic in 3 months  2. HL -Reviewed latest lipid panel from 04/2020: LDL much improved, slightly above goal: Lab Results  Component Value Date   CHOL 148 04/16/2020   HDL 53.10 04/16/2020   LDLCALC 83 04/16/2020   LDLDIRECT 173.0 02/13/2016   TRIG 63.0 04/16/2020   CHOLHDL 3 04/16/2020  -Continues Lipitor without side  effects   06/16/2020, MD PhD [facility]

## 2020-08-08 NOTE — Patient Instructions (Addendum)
Please continue: - Metformin ER 1000 mg 2x a day, with meals - Glipizide 10 mg 2x a day 15 min before meals - Actos 30 mg daily  Start checking sugars 1x a day, rotating check times.  Try to start a B12 supplement - 1000 mcg daily.  Please return in 3 months with your sugar log.

## 2020-08-09 ENCOUNTER — Other Ambulatory Visit (INDEPENDENT_AMBULATORY_CARE_PROVIDER_SITE_OTHER): Payer: Self-pay

## 2020-08-09 DIAGNOSIS — Z1211 Encounter for screening for malignant neoplasm of colon: Secondary | ICD-10-CM

## 2020-08-09 LAB — FECAL OCCULT BLOOD, IMMUNOCHEMICAL: Fecal Occult Bld: NEGATIVE

## 2020-08-26 ENCOUNTER — Ambulatory Visit (INDEPENDENT_AMBULATORY_CARE_PROVIDER_SITE_OTHER): Payer: Self-pay | Admitting: Family Medicine

## 2020-08-26 ENCOUNTER — Other Ambulatory Visit: Payer: Self-pay

## 2020-08-26 ENCOUNTER — Encounter: Payer: Self-pay | Admitting: Family Medicine

## 2020-08-26 ENCOUNTER — Telehealth: Payer: Self-pay

## 2020-08-26 VITALS — BP 130/74 | HR 88 | Temp 97.4°F | Wt 171.0 lb

## 2020-08-26 DIAGNOSIS — E1142 Type 2 diabetes mellitus with diabetic polyneuropathy: Secondary | ICD-10-CM

## 2020-08-26 DIAGNOSIS — B029 Zoster without complications: Secondary | ICD-10-CM

## 2020-08-26 MED ORDER — TRAMADOL HCL 50 MG PO TABS: 50.0000 mg | ORAL_TABLET | Freq: Three times a day (TID) | ORAL | 0 refills | Status: AC | PRN

## 2020-08-26 NOTE — Telephone Encounter (Signed)
Pt walked in with rash on lt shoulder up to the lt side of neck and pt said had rash since last wk; could not clarify which day rash started. Pt said difficult to get comfortable with rash and now pain level is 9 but pt does not have ins and does not want to go to UC or ED. Pt said he can stand the pain and wants appt at [facility]; there is no available appt at [facility] today and pt wants to schedule appt on 08/27/20. Alice scheduled pt an appt 08/27/20 at 2:30 with Dr Milinda Antis. Pt said the rash area hurts but has not seen any blisters. No CP,SOB,H/A, or dizziness. UC precautions given and pt voiced understanding.

## 2020-08-26 NOTE — Patient Instructions (Addendum)
Good to see you today  For pain, start with trying acetaminophen (Tylenol) 2 tablets, can take ibuprofen (Advil) 2 over the counter tablets 4 hours later and continue to alternate.  I have sent in a prescription pain medication to your pharmacy for severe pain. It may cause constipation, dizziness, drowsiness.   Shingles  Shingles, which is also known as herpes zoster, is an infection that causes a painful skin rash and fluid-filled blisters. It is caused by a virus. Shingles only develops in people who:  Have had chickenpox.  Have been given a medicine to protect against chickenpox (have been vaccinated). Shingles is rare in this group. What are the causes? Shingles is caused by varicella-zoster virus (VZV). This is the same virus that causes chickenpox. After a person is exposed to VZV, the virus stays in the body in an inactive (dormant) state. Shingles develops if the virus is reactivated. This can happen many years after the first (initial) exposure to VZV. It is not known what causes this virus to be reactivated. What increases the risk? People who have had chickenpox or received the chickenpox vaccine are at risk for shingles. Shingles infection is more common in people who:  Are older than age 36.  Have a weakened disease-fighting system (immune system), such as people with: ? HIV. ? AIDS. ? Cancer.  Are taking medicines that weaken the immune system, such as transplant medicines.  Are experiencing a lot of stress. What are the signs or symptoms? Early symptoms of this condition include itching, tingling, and pain in an area on your skin. Pain may be described as burning, stabbing, or throbbing. A few days or weeks after early symptoms start, a painful red rash appears. The rash is usually on one side of the body and has a band-like or belt-like pattern. The rash eventually turns into fluid-filled blisters that break open, change into scabs, and dry up in about 2-3 weeks. At  any time during the infection, you may also develop:  A fever.  Chills.  A headache.  An upset stomach. How is this diagnosed? This condition is diagnosed with a skin exam. Skin or fluid samples may be taken from the blisters before a diagnosis is made. These samples are examined under a microscope or sent to a lab for testing. How is this treated? The rash may last for several weeks. There is not a specific cure for this condition. Your health care provider will probably prescribe medicines to help you manage pain, recover more quickly, and avoid long-term problems. Medicines may include:  Antiviral drugs.  Anti-inflammatory drugs.  Pain medicines.  Anti-itching medicines (antihistamines). If the area involved is on your face, you may be referred to a specialist, such as an eye doctor (ophthalmologist) or an ear, nose, and throat (ENT) doctor (otolaryngologist) to help you avoid eye problems, chronic pain, or disability. Follow these instructions at home: Medicines  Take over-the-counter and prescription medicines only as told by your health care provider.  Apply an anti-itch cream or numbing cream to the affected area as told by your health care provider. Relieving itching and discomfort   Apply cold, wet cloths (cold compresses) to the area of the rash or blisters as told by your health care provider.  Cool baths can be soothing. Try adding baking soda or dry oatmeal to the water to reduce itching. Do not bathe in hot water. Blister and rash care  Keep your rash covered with a loose bandage (dressing). Wear loose-fitting clothing to  help ease the pain of material rubbing against the rash.  Keep your rash and blisters clean by washing the area with mild soap and cool water as told by your health care provider.  Check your rash every day for signs of infection. Check for: ? More redness, swelling, or pain. ? Fluid or blood. ? Warmth. ? Pus or a bad smell.  Do not  scratch your rash or pick at your blisters. To help avoid scratching: ? Keep your fingernails clean and cut short. ? Wear gloves or mittens while you sleep, if scratching is a problem. General instructions  Rest as told by your health care provider.  Keep all follow-up visits as told by your health care provider. This is important.  Wash your hands often with soap and water. If soap and water are not available, use hand sanitizer. Doing this lowers your chance of getting a bacterial skin infection.  Before your blisters change into scabs, your shingles infection can cause chickenpox in people who have never had it or have never been vaccinated against it. To prevent this from happening, avoid contact with other people, especially: ? Babies. ? Pregnant women. ? Children who have eczema. ? Elderly people who have transplants. ? People who have chronic illnesses, such as cancer or AIDS. Contact a health care provider if:  Your pain is not relieved with prescribed medicines.  Your pain does not get better after the rash heals.  You have signs of infection in the rash area, such as: ? More redness, swelling, or pain around the rash. ? Fluid or blood coming from the rash. ? The rash area feeling warm to the touch. ? Pus or a bad smell coming from the rash. Get help right away if:  The rash is on your face or nose.  You have facial pain, pain around your eye area, or loss of feeling on one side of your face.  You have difficulty seeing.  You have ear pain or have ringing in your ear.  You have a loss of taste.  Your condition gets worse. Summary  Shingles, which is also known as herpes zoster, is an infection that causes a painful skin rash and fluid-filled blisters.  This condition is diagnosed with a skin exam. Skin or fluid samples may be taken from the blisters and examined before the diagnosis is made.  Keep your rash covered with a loose bandage (dressing). Wear  loose-fitting clothing to help ease the pain of material rubbing against the rash.  Before your blisters change into scabs, your shingles infection can cause chickenpox in people who have never had it or have never been vaccinated against it. This information is not intended to replace advice given to you by your health care provider. Make sure you discuss any questions you have with your health care provider. Document Revised: 02/24/2019 Document Reviewed: 07/07/2017 Elsevier Patient Education  2020 [employer].

## 2020-08-26 NOTE — Progress Notes (Signed)
Subjective:    Patient ID: Douglas [name], male    DOB: Mar 07, 1946, 74 y.o.   MRN: [id number]  HPI Chief Complaint  Patient presents with  . Rash    x 6 days left shoulder. Pt states rash is causing shoulder pain and itching. Pt states he does alot of yard work and not sure if something bit him,    This is a 74 yo male who presents with above cc.  He has a 1 week history of rash on his left shoulder and upper arm.  He does not recall any fever, chills, flulike symptoms prior to rash appearing.  He is having quite a bit of pain in the area.  He has applied calamine lotion and hydrocortisone cream without relief.  He has not tried any other remedies for pain.  He has had quite a bit of difficulty sleeping at night.  He does not recall having chickenpox as a child.  He has not been vaccinated for shingles.  Diabetes mellitus type 2-slightly elevated hemoglobin A1c at last check.  He has upcoming visit with dietitian.  He has not been able to check his blood sugars because his machine has been acting up.  He thinks he needs to replace the battery.  Review of Systems Per HPI    Objective:   Physical Exam Vitals reviewed.  Constitutional:      General: He is not in acute distress.    Appearance: Normal appearance. He is normal weight. He is not ill-appearing, toxic-appearing or diaphoretic.  HENT:     Head: Normocephalic and atraumatic.  Cardiovascular:     Rate and Rhythm: Normal rate.  Pulmonary:     Effort: Pulmonary effort is normal.  Skin:    General: Skin is warm and dry.     Findings: Rash (Left shoulder and upper arm with erythematous, scabbed papules, one vesicle noted. No surrounding erythema or drainage. ) present.  Neurological:     Mental Status: He is alert and oriented to person, place, and time.      BP 130/74   Pulse 88   Temp (!) 97.4 F (36.3 C) (Temporal)   Wt 171 lb (77.6 kg)   SpO2 97%   BMI 27.60 kg/m  Wt Readings from Last 3 Encounters:  08/26/20  171 lb (77.6 kg)  08/08/20 173 lb 9.6 oz (78.7 kg)  07/24/20 172 lb (78 kg)        Assessment & Plan:  1. Herpes zoster without complication - Provided written and verbal information regarding diagnosis and treatment. -Follow-up precautions reviewed, signs symptoms of infection or no improvement in pain -He was advised to alternate acetaminophen and ibuprofen and given written instructions.  He was given small amount of tramadol for severe pain. -He was advised to get his shingles vaccine after symptoms have fully resolved - traMADol (ULTRAM) 50 MG tablet; Take 1 tablet (50 mg total) by mouth every 8 (eight) hours as needed for up to 5 days.  Dispense: 15 tablet; Refill: 0  2. Type 2 diabetes mellitus with polyneuropathy (HCC) -Advised him to notify office if he needs new glucometer and stressed importance of regularly checking his blood sugars  This visit occurred during the SARS-CoV-2 public health emergency.  Safety protocols were in place, including screening questions prior to the visit, additional usage of staff PPE, and extensive cleaning of exam room while observing appropriate contact time as indicated for disinfecting solutions.      Olean Ree, FNP-BC  [facility], [facility]  08/26/2020 12:51 PM

## 2020-08-26 NOTE — Telephone Encounter (Signed)
Will see him then 

## 2020-08-26 NOTE — Telephone Encounter (Signed)
Harlin Heys FNP had cancellation today at 12 noon. I spoke with pt and scheduled appt for pt to See Harlin Heys FNP today at 12 noon. 08/27/20 appt cancelled. Pt appreciative.

## 2020-08-26 NOTE — Telephone Encounter (Signed)
Noted  

## 2020-08-27 ENCOUNTER — Ambulatory Visit: Payer: Self-pay | Admitting: Family Medicine

## 2020-11-01 ENCOUNTER — Encounter: Payer: Self-pay | Admitting: Internal Medicine

## 2020-11-01 ENCOUNTER — Ambulatory Visit (INDEPENDENT_AMBULATORY_CARE_PROVIDER_SITE_OTHER): Payer: Self-pay | Admitting: Internal Medicine

## 2020-11-01 ENCOUNTER — Other Ambulatory Visit: Payer: Self-pay

## 2020-11-01 VITALS — BP 150/100 | HR 83 | Ht 66.0 in | Wt 173.0 lb

## 2020-11-01 DIAGNOSIS — E1142 Type 2 diabetes mellitus with diabetic polyneuropathy: Secondary | ICD-10-CM

## 2020-11-01 DIAGNOSIS — E785 Hyperlipidemia, unspecified: Secondary | ICD-10-CM

## 2020-11-01 LAB — POCT GLYCOSYLATED HEMOGLOBIN (HGB A1C): Hemoglobin A1C: 9.9 % — AB (ref 4.0–5.6)

## 2020-11-01 MED ORDER — INSULIN PEN NEEDLE 32G X 4 MM MISC
3 refills | Status: DC
Start: 2020-11-01 — End: 2021-12-09

## 2020-11-01 MED ORDER — TRESIBA FLEXTOUCH 200 UNIT/ML ~~LOC~~ SOPN: 12.0000 [IU] | PEN_INJECTOR | Freq: Every day | SUBCUTANEOUS | 3 refills | Status: DC

## 2020-11-01 NOTE — Progress Notes (Signed)
Patient ID: Douglas [name], male   DOB: [birth year], 74 y.o.   MRN: [id number]   This visit occurred during the SARS-CoV-2 public health emergency.  Safety protocols were in place, including screening questions prior to the visit, additional usage of staff PPE, and extensive cleaning of exam room while observing appropriate contact time as indicated for disinfecting solutions.   HPI: Douglas [name] is a 74 y.o.-year-old male, returning for follow-up forDM2, dx in ~2010, non-insulin-dependent, uncontrolled, with complications (peripheral neuropathy, ED).  Last visit 3 months ago. No insurance.  He had shingles in 08/2020. He did not have to use steroids.  Reviewed HbA1c levels: Lab Results  Component Value Date   HGBA1C 8.3 (A) 08/08/2020   HGBA1C 8.9 (A) 04/16/2020   HGBA1C 10.5 (A) 01/31/2020   HGBA1C 11.3 (A) 10/06/2019   HGBA1C 11.4 (H) 03/31/2019   HGBA1C 10.0 (A) 09/30/2018   HGBA1C 12.8 (A) 07/01/2018   HGBA1C 10.7 (H) 09/01/2017   HGBA1C 7.2 04/20/2016   HGBA1C 6.9 01/15/2016   HGBA1C 6.9 10/16/2015   HGBA1C 6.8 07/15/2015   HGBA1C 6.9 (H) 04/16/2015   HGBA1C 11.6 (H) 12/11/2014   HGBA1C 10.8 (H) 06/08/2014   HGBA1C 11.6 (H) 07/27/2013   HGBA1C 7.2 (H) 10/18/2009   Pt is on a regimen of: - Metformin ER 1000 mg twice a day >> 1000 mg in am as he forgets the dose at night - Glipizide 10 mg 2x a day before meals >> 10 mg before b'fast as he forgets the dose at night - Actos 30 mg daily He tried Invokana >> yeast infections, increased urination, dizziness >> would not want to restart it. Also refused injectables in the past.  She was not checking sugars at previous visits. Now checking 2x a day - brings a log: - am: n/c >> 193-291 - 2h after b'fast: n/c - before lunch: n/c >> 231, 240 - 2h after lunch: n/c - before dinner: n/c >> 95, 116-242 - 2h after dinner: n/c - bedtime: n/c - nighttime: n/c Lowest sugar was ? >> 95; It is unclear at which level she has hypoglycemia  awareness. Highest sugar was ? >> 291.  Glucometer: Onetouch   Pt's meals are: - Breakfast: coffee; scrambled eggs + sausage or bacon; bisquit from [employer] w/o bread >> oatmeal - Lunch: skip; banana - Dinner: baked chicken (prev. Fried) or cubed steak and gravy; kielbasa; beans; greens >> baked food, veggies, beans - Snacks: peanuts  -No CKD, last BUN/creatinine:  Lab Results  Component Value Date   BUN 15 04/16/2020   BUN 17 03/31/2019   CREATININE 0.95 04/16/2020   CREATININE 1.02 03/31/2019  Not on ACE inhibitor/ARB.  -+ HL; last set of lipids: Lab Results  Component Value Date   CHOL 148 04/16/2020   HDL 53.10 04/16/2020   LDLCALC 83 04/16/2020   LDLDIRECT 173.0 02/13/2016   TRIG 63.0 04/16/2020   CHOLHDL 3 04/16/2020  On Lipitor 40.  - last eye exam was in 02/2020: No DR, + mild cataracts  - no numbness and tingling in his feet, but + in hands  Pt has FH of DM in daughter  ROS: Constitutional: no weight gain/no weight loss, no fatigue, no subjective hyperthermia, no subjective hypothermia Eyes: no blurry vision, no xerophthalmia ENT: no sore throat, no nodules palpated in neck, no dysphagia, no odynophagia, no hoarseness Cardiovascular: no CP/no SOB/no palpitations/no leg swelling Respiratory: no cough/no SOB/no wheezing Gastrointestinal: no N/no V/no D/no C/no acid reflux Musculoskeletal: no muscle  aches/no joint aches Skin: no rashes, no hair loss Neurological: no tremors/+ numbness and tingling in hands/no dizziness  I reviewed pt's medications, allergies, PMH, social hx, family hx, and changes were documented in the history of present illness. Otherwise, unchanged from my initial visit note.  Past Medical History:  Diagnosis Date  . Allergic rhinitis due to pollen   . BPH (benign prostatic hypertrophy)   . ED (erectile dysfunction)   . GERD (gastroesophageal reflux disease)   . Hyperlipidemia   . Type II or unspecified type diabetes mellitus without  mention of complication, not stated as uncontrolled    Past Surgical History:  Procedure Laterality Date  . KIDNEY STONE SURGERY  ~1990  . WRIST SURGERY  1970's   Social History   Socioeconomic History  . Marital status: Married    Spouse name: Not on file  . Number of children: 3  . Years of education: Not on file  . Highest education level: Not on file  Occupational History  . Occupation: Public affairs consultant: [employer]    Comment: Retired  . Occupation: Curator    Comment: work on side  Tobacco Use  . Smoking status: Never Smoker  . Smokeless tobacco: Never Used  Substance and Sexual Activity  . Alcohol use: No  . Drug use: No  . Sexual activity: Not on file  Other Topics Concern  . Not on file  Social History Narrative   Divorced---then remarried 2016   3 children and 1 step son      No living will.   Wife to be health care POA---alternate is daughter or son   Would want resuscitation attempts   Wouldn't want tube feeds if cognitively unaware   Social Determinants of Health   Financial Resource Strain: Not on file  Food Insecurity: Not on file  Transportation Needs: Not on file  Physical Activity: Not on file  Stress: Not on file  Social Connections: Not on file  Intimate Partner Violence: Not on file   Current Outpatient Medications on File Prior to Visit  Medication Sig Dispense Refill  . atorvastatin (LIPITOR) 40 MG tablet Take 1 tablet by mouth once daily 90 tablet 3  . BAYER MICROLET LANCETS lancets Use as instructed to test blood sugar once daily dx:E11.65 100 each 3  . glipiZIDE (GLUCOTROL) 10 MG tablet TAKE 1 TABLET BY MOUTH TWICE DAILY BEFORE  A  MEAL 180 tablet 3  . glucose blood test strip Use as instructed to test blood sugar once daily dx: E11.65 100 each 3  . metFORMIN (GLUCOPHAGE-XR) 500 MG 24 hr tablet Take 2 tablets (1,000 mg total) by mouth 2 (two) times daily with a meal. 360 tablet 3  . pioglitazone (ACTOS) 30 MG tablet Take 1 tablet  (30 mg total) by mouth daily. 90 tablet 3   No current facility-administered medications on file prior to visit.   No Known Allergies Family History  Problem Relation Age of Onset  . Cancer Mother   . Diabetes Daughter    PE: BP (!) 150/100   Pulse 83   Ht 5\' 6"  (1.676 m)   Wt 173 lb (78.5 kg)   SpO2 96%   BMI 27.92 kg/m  Wt Readings from Last 3 Encounters:  11/01/20 173 lb (78.5 kg)  08/26/20 171 lb (77.6 kg)  08/08/20 173 lb 9.6 oz (78.7 kg)   Constitutional: overweight, in NAD Eyes: PERRLA, EOMI, no exophthalmos ENT: moist mucous membranes, no thyromegaly, no cervical lymphadenopathy  Cardiovascular: RRR, No MRG Respiratory: CTA B Gastrointestinal: abdomen soft, NT, ND, BS+ Musculoskeletal: no deformities, strength intact in all 4 Skin: moist, warm, no rashes Neurological: no tremor with outstretched hands, DTR normal in all 4  ASSESSMENT: 1. DM2, non-insulin-dependent, uncontrolled, with complications - Peripheral neuropathy - ED  PLAN:  1. Patient with longstanding, uncontrolled, type 2 diabetes, on oral antidiabetic regimen with Metformin, sulfonylurea, and TZD. We discussed about improving diet at last visit and I recommended to cut down fat. We discussed about the concept of insulin resistance and explained the importance of improving meals. We switched to Metformin extended release as he could not tolerate regular Metformin well. He does not have insurance so we could not use a GLP-1 receptor agonist. Also, she wanted to avoid injections. We tried an SGLT2 inhibitor but he developed yeast infections, increased urination and dizziness, and did not want to retry. Before last visit, he started to change his diet: No concentrated sweets, no white rice, no fried foods, only lean meats if eating meat at all. At last visit, HbA1c was lower, at 8.3%. However, he was still not checking sugars so I could not change his regimen at that time. I strongly advised him to start  checking once a day, rotating check times. I gave him sugar logs and explained target ranges. -At today's visit, he brings a log for the last 2 weeks, in which he checked his blood sugars twice a day.  They are very high in the morning, in the 200s range, and still elevated before lunch but improved to before dinner, when he even had a blood sugar in the 90s.  He tells me that he is missing his evening Metformin and glipizide doses as he forgets.  He started with his medications on the table so that he sees them when he eats to help remembering them.  At this point, we discussed that his regimen is not sufficient, and I suggested addition of insulin.  He agrees with this.  We gave him a sample of [place] and we will start at a low dose, 12 units daily and increase as needed based on his morning blood sugars.  We also printed patient assistance form for him and advised him to fill out the application and bring it back to us. -I demonstrated the correct insulin injection techniques -I demonstrated insulin pen use -As above possible side effects of insulin including hypoglycemia and weight gain -Advised him to check his sugars more frequently after he starts insulin to watch for hypoglycemia - I suggested to:  Patient Instructions  Please continue: - Metformin ER 1000 mg 2x a day, with meals - Glipizide 10 mg 2x a day 15 min before meals - Actos 30 mg daily  Please start: - Tresiba 12 units daily - increase by 2 units every 3-4 days until sugars in am are <140 or you get to 30 units  When injecting insulin:  Inject in the abdomen  Rotate the injection sites around the belly button  Change needle for each injection  Keep needle in for 10 sec after last unit of insulin in  Keep the insulin in use out of the fridge  Please bring back the Pt. Assistance Form.  Please return in 1.5 months with your sugar log.   - we checked his HbA1c: 9.9% (higher) - advised to check sugars at different  times of the day - 1-2x a day, rotating check times - advised for yearly eye exams >> he  is UTD - return to clinic in 3 months  2. HL -Reviewed latest lipid panel from 04/2020: LDL much improved, now only slightly above goal: The rest of the fractions were at goal: Lab Results  Component Value Date   CHOL 148 04/16/2020   HDL 53.10 04/16/2020   LDLCALC 83 04/16/2020   LDLDIRECT 173.0 02/13/2016   TRIG 63.0 04/16/2020   CHOLHDL 3 04/16/2020  -Continues Lipitor 40 without side effects   Carlus Pavlov, MD PhD [facility] Endocrinology

## 2020-11-01 NOTE — Patient Instructions (Addendum)
Please continue: - Metformin ER 1000 mg 2x a day, with meals - Glipizide 10 mg 2x a day 15 min before meals - Actos 30 mg daily  Please start: - Tresiba 12 units daily - increase by 2 units every 3-4 days until sugars in am are <140 or you get to 30 units  When injecting insulin:  Inject in the abdomen  Rotate the injection sites around the belly button  Change needle for each injection  Keep needle in for 10 sec after last unit of insulin in  Keep the insulin in use out of the fridge  Please bring back the Pt. Assistance Form.  Please return in 1.5 months with your sugar log.

## 2020-12-13 ENCOUNTER — Ambulatory Visit: Payer: Self-pay | Admitting: Internal Medicine

## 2020-12-13 NOTE — Progress Notes (Deleted)
Patient ID: AVID GUILLETTE, male   DOB: 19-Jul-1946, 75 y.o.   MRN: [id number]   This visit occurred during the SARS-CoV-2 public health emergency.  Safety protocols were in place, including screening questions prior to the visit, additional usage of staff PPE, and extensive cleaning of exam room while observing appropriate contact time as indicated for disinfecting solutions.    HPI: Douglas [name] is a 75 y.o.-year-old male, returning for follow-up forDM2, dx in ~2010, insulin-dependent since 10/2020, uncontrolled, with complications (peripheral neuropathy, ED).  Last visit 1.5 months ago. No insurance.  Reviewed HbA1c levels: Lab Results  Component Value Date   HGBA1C 9.9 (A) 11/01/2020   HGBA1C 8.3 (A) 08/08/2020   HGBA1C 8.9 (A) 04/16/2020   HGBA1C 10.5 (A) 01/31/2020   HGBA1C 11.3 (A) 10/06/2019   HGBA1C 11.4 (H) 03/31/2019   HGBA1C 10.0 (A) 09/30/2018   HGBA1C 12.8 (A) 07/01/2018   HGBA1C 10.7 (H) 09/01/2017   HGBA1C 7.2 04/20/2016   HGBA1C 6.9 01/15/2016   HGBA1C 6.9 10/16/2015   HGBA1C 6.8 07/15/2015   HGBA1C 6.9 (H) 04/16/2015   HGBA1C 11.6 (H) 12/11/2014   HGBA1C 10.8 (H) 06/08/2014   HGBA1C 11.6 (H) 07/27/2013   HGBA1C 7.2 (H) 10/18/2009   Pt is on a regimen of: - Metformin ER 1000 mg twice a day >> 1000 mg in a.m. as he forgets the dose at night - Glipizide 10 mg 2x a day before meals >> 10 mg before breakfast as he forgets the dose at night - Actos 30 mg daily - Tresiba 12 units daily-started 10/2020 He tried Invokana >> yeast infections, increased urination, dizziness >> would not want to restart it. Also refused injectables in the past.  She was not checking sugars at previous visits, however, at last visit, he started to check twice a day.  He continues this now - am: n/c >> 193-291 - 2h after b'fast: n/c - before lunch: n/c >> 231, 240 - 2h after lunch: n/c - before dinner: n/c >> 95, 116-242 - 2h after dinner: n/c - bedtime: n/c - nighttime: n/c Lowest  sugar was ? >> 95; It is unclear at which level she has hypoglycemia awareness. Highest sugar was ? >> 291.  Glucometer: Onetouch   Pt's meals are: - Breakfast: coffee; scrambled eggs + sausage or bacon; bisquit from [employer] w/o bread >> oatmeal - Lunch: skip; banana - Dinner: baked chicken (prev. Fried) or cubed steak and gravy; kielbasa; beans; greens >> baked food, veggies, beans - Snacks: peanuts  -No CKD, last BUN/creatinine:  Lab Results  Component Value Date   BUN 15 04/16/2020   BUN 17 03/31/2019   CREATININE 0.95 04/16/2020   CREATININE 1.02 03/31/2019  Not on ACE inhibitor/ARB.  -+ HL; last set of lipids: Lab Results  Component Value Date   CHOL 148 04/16/2020   HDL 53.10 04/16/2020   LDLCALC 83 04/16/2020   LDLDIRECT 173.0 02/13/2016   TRIG 63.0 04/16/2020   CHOLHDL 3 04/16/2020  On Lipitor 40.  - last eye exam was in 02/2020: No DR, + mild cataracts  - no numbness and tingling in his feet, but he has the CGM  Pt has FH of DM in daughter  ROS: Constitutional: no weight gain/no weight loss, no fatigue, no subjective hyperthermia, no subjective hypothermia Eyes: no blurry vision, no xerophthalmia ENT: no sore throat, no nodules palpated in neck, no dysphagia, no odynophagia, no hoarseness Cardiovascular: no CP/no SOB/no palpitations/no leg swelling Respiratory: no cough/no SOB/no wheezing Gastrointestinal:  no N/no V/no D/no C/no acid reflux Musculoskeletal: no muscle aches/no joint aches Skin: no rashes, no hair loss Neurological: no tremors/+ numbness/+ tingling/no dizziness  I reviewed pt's medications, allergies, PMH, social hx, family hx, and changes were documented in the history of present illness. Otherwise, unchanged from my initial visit note.  Past Medical History:  Diagnosis Date  . Allergic rhinitis due to pollen   . BPH (benign prostatic hypertrophy)   . ED (erectile dysfunction)   . GERD (gastroesophageal reflux disease)   .  Hyperlipidemia   . Type II or unspecified type diabetes mellitus without mention of complication, not stated as uncontrolled    Past Surgical History:  Procedure Laterality Date  . KIDNEY STONE SURGERY  ~1990  . WRIST SURGERY  1970's   Social History   Socioeconomic History  . Marital status: Married    Spouse name: Not on file  . Number of children: 3  . Years of education: Not on file  . Highest education level: Not on file  Occupational History  . Occupation: Public affairs consultant: [employer]    Comment: Retired  . Occupation: Curator    Comment: work on side  Tobacco Use  . Smoking status: Never Smoker  . Smokeless tobacco: Never Used  Substance and Sexual Activity  . Alcohol use: No  . Drug use: No  . Sexual activity: Not on file  Other Topics Concern  . Not on file  Social History Narrative   Divorced---then remarried 2016   3 children and 1 step son      No living will.   Wife to be health care POA---alternate is daughter or son   Would want resuscitation attempts   Wouldn't want tube feeds if cognitively unaware   Social Determinants of Health   Financial Resource Strain: Not on file  Food Insecurity: Not on file  Transportation Needs: Not on file  Physical Activity: Not on file  Stress: Not on file  Social Connections: Not on file  Intimate Partner Violence: Not on file   Current Outpatient Medications on File Prior to Visit  Medication Sig Dispense Refill  . atorvastatin (LIPITOR) 40 MG tablet Take 1 tablet by mouth once daily 90 tablet 3  . BAYER MICROLET LANCETS lancets Use as instructed to test blood sugar once daily dx:E11.65 100 each 3  . glipiZIDE (GLUCOTROL) 10 MG tablet TAKE 1 TABLET BY MOUTH TWICE DAILY BEFORE  A  MEAL 180 tablet 3  . glucose blood test strip Use as instructed to test blood sugar once daily dx: E11.65 100 each 3  . insulin degludec (TRESIBA FLEXTOUCH) 200 UNIT/ML FlexTouch Pen Inject 12-18 Units into the skin daily. 9 mL 3   . Insulin Pen Needle 32G X 4 MM MISC Use 1x a day 100 each 3  . metFORMIN (GLUCOPHAGE-XR) 500 MG 24 hr tablet Take 2 tablets (1,000 mg total) by mouth 2 (two) times daily with a meal. 360 tablet 3  . pioglitazone (ACTOS) 30 MG tablet Take 1 tablet (30 mg total) by mouth daily. 90 tablet 3   No current facility-administered medications on file prior to visit.   No Known Allergies Family History  Problem Relation Age of Onset  . Cancer Mother   . Diabetes Daughter    PE: There were no vitals taken for this visit. Wt Readings from Last 3 Encounters:  11/01/20 173 lb (78.5 kg)  08/26/20 171 lb (77.6 kg)  08/08/20 173 lb 9.6 oz (78.7  kg)   Constitutional: overweight, in NAD Eyes: PERRLA, EOMI, no exophthalmos ENT: moist mucous membranes, no thyromegaly, no cervical lymphadenopathy Cardiovascular: RRR, No MRG Respiratory: CTA B Gastrointestinal: abdomen soft, NT, ND, BS+ Musculoskeletal: no deformities, strength intact in all 4 Skin: moist, warm, no rashes Neurological: no tremor with outstretched hands, DTR normal in all 4  ASSESSMENT: 1. DM2, insulin-dependent, uncontrolled, with complications - Peripheral neuropathy - ED  PLAN:  1. Patient with longstanding, uncontrolled, type 2 diabetes, on a regimen with Metformin, sulfonylurea, and TZD, to which we added basal insulin at last visit.  At that time, HbA1c was higher, at 9.9% despite the change in diet to avoid concentrated sweets, white rice, fried foods.  Sugars are very high in the morning, in the 200s range and they were still high before lunch but improved around dinnertime. -At this visit,  - I suggested to:  Patient Instructions  Please continue: - Metformin ER 1000 mg 2x a day, with meals - Glipizide 10 mg 2x a day 15 min before meals - Actos 30 mg daily - Tresiba 12 units daily  Please return in 3 months with your sugar log.   - we checked his HbA1c: 7%  - advised to check sugars at different times of the day  - 1-2x a day, rotating check times - advised for yearly eye exams >> he is UTD - return to clinic in 3 months  2. HL -Reviewed latest lipid panel from 04/2020: Fractions at goal with exception of a slightly high LDL: Lab Results  Component Value Date   CHOL 148 04/16/2020   HDL 53.10 04/16/2020   LDLCALC 83 04/16/2020   LDLDIRECT 173.0 02/13/2016   TRIG 63.0 04/16/2020   CHOLHDL 3 04/16/2020  -Continues Lipitor 40 without side effects   Carlus Pavlov, MD PhD [facility]

## 2021-01-05 ENCOUNTER — Other Ambulatory Visit: Payer: Self-pay | Admitting: Internal Medicine

## 2021-02-07 ENCOUNTER — Encounter: Payer: Self-pay | Admitting: Internal Medicine

## 2021-07-09 ENCOUNTER — Other Ambulatory Visit: Payer: Self-pay | Admitting: Internal Medicine

## 2021-07-09 ENCOUNTER — Telehealth: Payer: Self-pay | Admitting: Internal Medicine

## 2021-07-09 MED ORDER — GLIPIZIDE 10 MG PO TABS: 10.0000 mg | ORAL_TABLET | Freq: Two times a day (BID) | ORAL | 3 refills | Status: DC

## 2021-07-09 NOTE — Telephone Encounter (Signed)
Pt need refill on glipiZIDE (GLUCOTROL) 10 MG tablet sent to [facility] on [address]

## 2021-07-09 NOTE — Telephone Encounter (Signed)
07/09/21-

## 2021-07-09 NOTE — Telephone Encounter (Signed)
Rx sent electronically.  

## 2021-07-25 ENCOUNTER — Encounter: Payer: Self-pay | Admitting: Internal Medicine

## 2021-07-31 ENCOUNTER — Encounter: Payer: Self-pay | Admitting: Internal Medicine

## 2021-08-20 ENCOUNTER — Ambulatory Visit (INDEPENDENT_AMBULATORY_CARE_PROVIDER_SITE_OTHER): Payer: Medicare Other | Admitting: Internal Medicine

## 2021-08-20 ENCOUNTER — Other Ambulatory Visit: Payer: Self-pay

## 2021-08-20 ENCOUNTER — Encounter: Payer: Self-pay | Admitting: Internal Medicine

## 2021-08-20 VITALS — BP 140/86 | HR 94 | Temp 97.5°F | Ht 66.0 in | Wt 175.0 lb

## 2021-08-20 DIAGNOSIS — E785 Hyperlipidemia, unspecified: Secondary | ICD-10-CM

## 2021-08-20 DIAGNOSIS — E1142 Type 2 diabetes mellitus with diabetic polyneuropathy: Secondary | ICD-10-CM | POA: Diagnosis not present

## 2021-08-20 DIAGNOSIS — K219 Gastro-esophageal reflux disease without esophagitis: Secondary | ICD-10-CM

## 2021-08-20 DIAGNOSIS — N401 Enlarged prostate with lower urinary tract symptoms: Secondary | ICD-10-CM

## 2021-08-20 DIAGNOSIS — Z1211 Encounter for screening for malignant neoplasm of colon: Secondary | ICD-10-CM

## 2021-08-20 DIAGNOSIS — Z23 Encounter for immunization: Secondary | ICD-10-CM

## 2021-08-20 DIAGNOSIS — Z0000 Encounter for general adult medical examination without abnormal findings: Secondary | ICD-10-CM | POA: Diagnosis not present

## 2021-08-20 DIAGNOSIS — N138 Other obstructive and reflux uropathy: Secondary | ICD-10-CM

## 2021-08-20 LAB — COMPREHENSIVE METABOLIC PANEL
ALT: 20 U/L (ref 0–53)
AST: 14 U/L (ref 0–37)
Albumin: 4.2 g/dL (ref 3.5–5.2)
Alkaline Phosphatase: 64 U/L (ref 39–117)
BUN: 16 mg/dL (ref 6–23)
CO2: 24 mEq/L (ref 19–32)
Calcium: 9.5 mg/dL (ref 8.4–10.5)
Chloride: 102 mEq/L (ref 96–112)
Creatinine, Ser: 0.92 mg/dL (ref 0.40–1.50)
GFR: 82.31 mL/min (ref >60.00)
Glucose, Bld: 234 mg/dL — ABNORMAL HIGH (ref 70–99)
Potassium: 4.2 mEq/L (ref 3.5–5.1)
Sodium: 136 mEq/L (ref 135–145)
Total Bilirubin: 0.5 mg/dL (ref 0.2–1.2)
Total Protein: 7 g/dL (ref 6.0–8.3)

## 2021-08-20 LAB — MICROALBUMIN / CREATININE URINE RATIO
Creatinine,U: 66.3 mg/dL
Microalb Creat Ratio: 23.7 mg/g (ref 0.0–30.0)
Microalb, Ur: 15.7 mg/dL — ABNORMAL HIGH (ref 0.0–1.9)

## 2021-08-20 LAB — HM DIABETES FOOT EXAM

## 2021-08-20 LAB — CBC
HCT: 45.1 % (ref 39.0–52.0)
Hemoglobin: 14.7 g/dL (ref 13.0–17.0)
MCHC: 32.6 g/dL (ref 30.0–36.0)
MCV: 87.3 fl (ref 78.0–100.0)
Platelets: 176 10*3/uL (ref 150.0–400.0)
RBC: 5.17 Mil/uL (ref 4.22–5.81)
RDW: 14.7 % (ref 11.5–15.5)
WBC: 5.1 10*3/uL (ref 4.0–10.5)

## 2021-08-20 LAB — LIPID PANEL
Cholesterol: 155 mg/dL (ref 0–200)
HDL: 56.6 mg/dL (ref >39.00)
LDL Cholesterol: 84 mg/dL (ref 0–99)
NonHDL: 98.79
Total CHOL/HDL Ratio: 3
Triglycerides: 74 mg/dL (ref 0.0–149.0)
VLDL: 14.8 mg/dL (ref 0.0–40.0)

## 2021-08-20 LAB — HEMOGLOBIN A1C: Hgb A1c MFr Bld: 9.2 % — ABNORMAL HIGH (ref 4.6–6.5)

## 2021-08-20 NOTE — Assessment & Plan Note (Signed)
No problems with statin 

## 2021-08-20 NOTE — Assessment & Plan Note (Signed)
Doing well without meds lately

## 2021-08-20 NOTE — Assessment & Plan Note (Signed)
I have personally reviewed the Medicare Annual Wellness questionnaire and have noted 1. The patient's medical and social history 2. Their use of alcohol, tobacco or illicit drugs 3. Their current medications and supplements 4. The patient's functional ability including ADL's, fall risks, home safety risks and hearing or visual             impairment. 5. Diet and physical activities 6. Evidence for depression or mood disorders  The patients weight, height, BMI and visual acuity have been recorded in the chart I have made referrals, counseling and provided education to the patient based review of the above and I have provided the pt with a written personalized care plan for preventive services.  I have provided you with a copy of your personalized plan for preventive services. Please take the time to review along with your updated medication list.  Stays active--discussed exercise FIT No PSA due to age Flu vaccine today Bivalent COVID at pharmacy shingrix when covered

## 2021-08-20 NOTE — Addendum Note (Signed)
Addended by: Eual Fines on: 08/20/2021 10:10 AM   Modules accepted: Orders

## 2021-08-20 NOTE — Assessment & Plan Note (Addendum)
Unclear if acceptable control on the glipizide, actos and metformin Highly opposed to needles Will see back in 3 months if not good enough Consider oral semaglutide  Mild neuropathy--Rx not needed

## 2021-08-20 NOTE — Assessment & Plan Note (Signed)
Ongoing symptoms but not enough to warrant trial of tamsulosin

## 2021-08-20 NOTE — Progress Notes (Signed)
Subjective:    Patient ID: Douglas [name], male    DOB: 1946-06-05, 75 y.o.   MRN: [id number]  HPI Here for Medicare wellness visit and follow up of chronic health conditions This visit occurred during the SARS-CoV-2 public health emergency.  Safety protocols were in place, including screening questions prior to the visit, additional usage of staff PPE, and extensive cleaning of exam room while observing appropriate contact time as indicated for disinfecting solutions.   Reviewed advanced directives Reviewed other doctors----[facility], Dr Rocco Serene Still does work on the side--stays active with this  No hospitalizations or surgery in the past year No alcohol or tobacco Vision and hearing are fine No falls No depression or anhedonia Independent with instrumental ADLs No sig memory problems  Stopped with Dr Kayren Eaves now has insurance Continues on the 3 oral medications Didn't start the tresiba---cost Eating better--avoiding sweets (hard due to wife pushing donuts, etc) Hasn't checked sugars in past month--usually under 140 fasting No low sugar reactions  Voids with mildly reduced stream Nocturia x 2-3 at worst No sig dribbling--does have some urgency  No problems with statin  No heartburn  No dysphagia  Current Outpatient Medications on File Prior to Visit  Medication Sig Dispense Refill   atorvastatin (LIPITOR) 40 MG tablet Take 1 tablet by mouth once daily 90 tablet 3   BAYER MICROLET LANCETS lancets Use as instructed to test blood sugar once daily dx:E11.65 100 each 3   glipiZIDE (GLUCOTROL) 10 MG tablet Take 1 tablet (10 mg total) by mouth 2 (two) times daily before a meal. 180 tablet 3   glucose blood test strip Use as instructed to test blood sugar once daily dx: E11.65 100 each 3   Insulin Pen Needle 32G X 4 MM MISC Use 1x a day 100 each 3   metFORMIN (GLUCOPHAGE-XR) 500 MG 24 hr tablet Take 2 tablets (1,000 mg total) by mouth 2 (two) times  daily with a meal. 360 tablet 3   pioglitazone (ACTOS) 30 MG tablet Take 1 tablet by mouth once daily 90 tablet 2   No current facility-administered medications on file prior to visit.    No Known Allergies  Past Medical History:  Diagnosis Date   Allergic rhinitis due to pollen    BPH (benign prostatic hypertrophy)    ED (erectile dysfunction)    GERD (gastroesophageal reflux disease)    Hyperlipidemia    Type II or unspecified type diabetes mellitus without mention of complication, not stated as uncontrolled     Past Surgical History:  Procedure Laterality Date   KIDNEY STONE SURGERY  ~1990   WRIST SURGERY  1970's    Family History  Problem Relation Age of Onset   Cancer Mother    Diabetes Daughter     Social History   Socioeconomic History   Marital status: Married    Spouse name: Not on file   Number of children: 3   Years of education: Not on file   Highest education level: Not on file  Occupational History   Occupation: Zone Event organiser: [employer]    Comment: Retired   Occupation: Curator    Comment: work on side  Tobacco Use   Smoking status: Never   Smokeless tobacco: Never  Substance and Sexual Activity   Alcohol use: No   Drug use: No   Sexual activity: Not on file  Other Topics Concern   Not on file  Social History Narrative   Divorced---then  remarried 2016   3 children and 1 step son      No living will.   Wife to be health care POA---alternate is daughter or son   Would want resuscitation attempts   Wouldn't want tube feeds if cognitively unaware   Social Determinants of Health   Financial Resource Strain: Not on file  Food Insecurity: Not on file  Transportation Needs: Not on file  Physical Activity: Not on file  Stress: Not on file  Social Connections: Not on file  Intimate Partner Violence: Not on file   Review of Systems Appetite is good Weight is stable Overdue for dentist--will set up now that insurance is in Wears  seat belt Bowels are fine--no blood No sig back or joint pains No suspicious skin lesions     Objective:   Physical Exam Constitutional:      Appearance: Normal appearance.  HENT:     Mouth/Throat:     Comments: No lesions Eyes:     Conjunctiva/sclera: Conjunctivae normal.     Pupils: Pupils are equal, round, and reactive to light.  Cardiovascular:     Rate and Rhythm: Normal rate and regular rhythm.     Pulses: Normal pulses.     Heart sounds: No murmur heard.   No gallop.  Pulmonary:     Effort: Pulmonary effort is normal.     Breath sounds: Normal breath sounds. No wheezing or rales.  Abdominal:     Palpations: Abdomen is soft.     Tenderness: There is no abdominal tenderness.  Musculoskeletal:     Cervical back: Neck supple.     Right lower leg: No edema.     Left lower leg: No edema.  Lymphadenopathy:     Cervical: No cervical adenopathy.  Skin:    Findings: No rash.     Comments: No foot lesions  Neurological:     Mental Status: He is alert and oriented to person, place, and time.     Comments: President---"Douglas [name], Douglas [name]" 100-93-87---trouble with numbers [id number] Recall 3/3  Fairly normal sensation in feet  Psychiatric:        Mood and Affect: Mood normal.        Behavior: Behavior normal.           Assessment & Plan:

## 2021-11-21 ENCOUNTER — Telehealth: Payer: Self-pay | Admitting: Internal Medicine

## 2021-11-21 NOTE — Telephone Encounter (Signed)
Tried to call pt. No answer and no VM to leave a message. Dr Alphonsus Sias is gone for the day.

## 2021-11-21 NOTE — Telephone Encounter (Signed)
Douglas [name] called in and stated that he is on pioglitazone (ACTOS) 30 MG tablet and it gave him a yeast infection and wanted to if he can a cream to help like before.  Pharmacy- [facility]- [address]

## 2021-11-24 MED ORDER — KETOCONAZOLE 2 % EX CREA: TOPICAL_CREAM | Freq: Every day | CUTANEOUS | 1 refills | Status: DC

## 2021-11-24 NOTE — Telephone Encounter (Signed)
I have tried to call him several times. The cell number does not have a VM set up and the home number on file will not go through.

## 2021-11-24 NOTE — Addendum Note (Signed)
Addended by: Tillman Abide I on: 11/24/2021 12:40 PM   Modules accepted: Orders

## 2021-11-24 NOTE — Telephone Encounter (Signed)
Please let him know it is usually invokana (not on anymore) that give yeast infections--not the pioglitazone. I did sent a refill of the yeast cream for him though

## 2021-11-25 ENCOUNTER — Ambulatory Visit: Payer: Medicare Other | Admitting: Internal Medicine

## 2021-11-25 ENCOUNTER — Other Ambulatory Visit: Payer: Self-pay

## 2021-11-25 NOTE — Telephone Encounter (Signed)
Tried calling pt, again. No VM set up. He has an OV 12-02-21.

## 2021-12-02 ENCOUNTER — Telehealth: Payer: Self-pay | Admitting: Internal Medicine

## 2021-12-02 ENCOUNTER — Ambulatory Visit: Payer: Medicare Other | Admitting: Internal Medicine

## 2021-12-02 NOTE — Telephone Encounter (Signed)
I called both numbers in his chart and no answer and no VM. Dr Silvio Pate has already left for the day and he will not just hand out an antibiotic without being seen. Hopefully he is able to keep his appointment tomorrow.

## 2021-12-02 NOTE — Telephone Encounter (Signed)
Mr. Douglas [name] called in and wanted to know if he can get an antibotic due to he wants something to work from the inside in and he forgot about his appointment today and I scheduled him for tomorrow for 1/18 @830 

## 2021-12-03 ENCOUNTER — Ambulatory Visit: Payer: Medicare Other | Admitting: Internal Medicine

## 2021-12-09 ENCOUNTER — Encounter: Payer: Self-pay | Admitting: Internal Medicine

## 2021-12-09 ENCOUNTER — Ambulatory Visit (INDEPENDENT_AMBULATORY_CARE_PROVIDER_SITE_OTHER): Payer: Medicare Other | Admitting: Internal Medicine

## 2021-12-09 ENCOUNTER — Other Ambulatory Visit: Payer: Self-pay

## 2021-12-09 VITALS — BP 126/82 | HR 97 | Temp 97.3°F | Ht 66.0 in | Wt 171.0 lb

## 2021-12-09 DIAGNOSIS — E1142 Type 2 diabetes mellitus with diabetic polyneuropathy: Secondary | ICD-10-CM | POA: Diagnosis not present

## 2021-12-09 DIAGNOSIS — B372 Candidiasis of skin and nail: Secondary | ICD-10-CM

## 2021-12-09 DIAGNOSIS — E785 Hyperlipidemia, unspecified: Secondary | ICD-10-CM | POA: Diagnosis not present

## 2021-12-09 LAB — POCT GLYCOSYLATED HEMOGLOBIN (HGB A1C): Hemoglobin A1C: 11.2 % — AB (ref 4.0–5.6)

## 2021-12-09 MED ORDER — FLUCONAZOLE 150 MG PO TABS: 150.0000 mg | ORAL_TABLET | ORAL | 5 refills | Status: DC

## 2021-12-09 MED ORDER — EMPAGLIFLOZIN 10 MG PO TABS: 10.0000 mg | ORAL_TABLET | Freq: Every day | ORAL | 11 refills | Status: DC

## 2021-12-09 NOTE — Patient Instructions (Signed)
You should look into a healthy low carb diet like [place].

## 2021-12-09 NOTE — Assessment & Plan Note (Signed)
Continues on atorvastatin 20mg  daily

## 2021-12-09 NOTE — Assessment & Plan Note (Signed)
Lab Results  Component Value Date   HGBA1C 9.2 (H) 08/20/2021   Today up to 11.2% Must have been from his holiday eating On actos 30, glipizide 10 bid, metformin 1000 bid Will add jardiance 10mg  He needs to restart checking sugars

## 2021-12-09 NOTE — Progress Notes (Signed)
Subjective:    Patient ID: Douglas [name], male    DOB: 11/12/1946, 76 y.o.   MRN: [id number]  HPI Here for follow up of poorly controlled diabetes  He feels he is doing some better with his eating--despite the holidays Weight is down a few pounds No dizziness Hasn't been checking sugars---just got equipment to restart this  Continues on the 3 diabetes medications  Having ongoing yeast infection in groin Ketoconazole cream only helping a little  Current Outpatient Medications on File Prior to Visit  Medication Sig Dispense Refill   atorvastatin (LIPITOR) 40 MG tablet Take 1 tablet by mouth once daily 90 tablet 3   BAYER MICROLET LANCETS lancets Use as instructed to test blood sugar once daily dx:E11.65 100 each 3   glipiZIDE (GLUCOTROL) 10 MG tablet Take 1 tablet (10 mg total) by mouth 2 (two) times daily before a meal. 180 tablet 3   glucose blood test strip Use as instructed to test blood sugar once daily dx: E11.65 100 each 3   ketoconazole (NIZORAL) 2 % cream Apply topically daily. 30 g 1   metFORMIN (GLUCOPHAGE-XR) 500 MG 24 hr tablet Take 2 tablets (1,000 mg total) by mouth 2 (two) times daily with a meal. 360 tablet 3   pioglitazone (ACTOS) 30 MG tablet Take 1 tablet by mouth once daily 90 tablet 2   No current facility-administered medications on file prior to visit.    No Known Allergies  Past Medical History:  Diagnosis Date   Allergic rhinitis due to pollen    BPH (benign prostatic hypertrophy)    ED (erectile dysfunction)    GERD (gastroesophageal reflux disease)    Hyperlipidemia    Type II or unspecified type diabetes mellitus without mention of complication, not stated as uncontrolled     Past Surgical History:  Procedure Laterality Date   KIDNEY STONE SURGERY  ~1990   WRIST SURGERY  1970's    Family History  Problem Relation Age of Onset   Cancer Mother    Diabetes Daughter     Social History   Socioeconomic History   Marital status: Married     Spouse name: Not on file   Number of children: 3   Years of education: Not on file   Highest education level: Not on file  Occupational History   Occupation: Zone Event organiser: [employer]    Comment: Retired   Occupation: Curator    Comment: work on side  Tobacco Use   Smoking status: Never   Smokeless tobacco: Never  Substance and Sexual Activity   Alcohol use: No   Drug use: No   Sexual activity: Not on file  Other Topics Concern   Not on file  Social History Narrative   Divorced---then remarried 2016   3 children and 1 step son      No living will.   Wife to be health care POA---alternate is daughter or son   Would want resuscitation attempts   Wouldn't want tube feeds if cognitively unaware   Social Determinants of Health   Financial Resource Strain: Not on file  Food Insecurity: Not on file  Transportation Needs: Not on file  Physical Activity: Not on file  Stress: Not on file  Social Connections: Not on file  Intimate Partner Violence: Not on file   Review of Systems No chest pain No SOB Has slight cough--exposed to respiratory illness     Objective:   Physical Exam Constitutional:  Appearance: Normal appearance.  Cardiovascular:     Rate and Rhythm: Normal rate and regular rhythm.     Heart sounds: No murmur heard.   No gallop.  Pulmonary:     Effort: Pulmonary effort is normal.     Breath sounds: Normal breath sounds. No wheezing or rales.  Musculoskeletal:     Cervical back: Neck supple.     Right lower leg: No edema.     Left lower leg: No edema.  Lymphadenopathy:     Cervical: No cervical adenopathy.  Neurological:     Mental Status: He is alert.           Assessment & Plan:

## 2021-12-09 NOTE — Assessment & Plan Note (Signed)
Likely due to the high sugars May get worse with jardiance Will start weekly fluconazole 150mg 

## 2022-01-17 ENCOUNTER — Other Ambulatory Visit: Payer: Self-pay | Admitting: Internal Medicine

## 2022-03-10 ENCOUNTER — Ambulatory Visit: Payer: Medicare Other | Admitting: Internal Medicine

## 2022-03-18 ENCOUNTER — Telehealth: Payer: Self-pay

## 2022-03-18 ENCOUNTER — Ambulatory Visit: Payer: Medicare Other | Admitting: Internal Medicine

## 2022-03-18 NOTE — Telephone Encounter (Signed)
Unable to reach pt by phone. I cancelled appt per access nurse request by pt and sending note to [facility] CMA  and [facility] support because pt did want to reschedule. ?

## 2022-03-18 NOTE — Telephone Encounter (Signed)
[facility] Night - Client ?Nonclinical Telephone Record  ?AccessNurse? ?Client [facility] Night - Client ?Client Site [facility] - Night ?Provider Tillman Abide- MD ?Contact Type Call ?Who Is Calling Patient / Member / Family / Caregiver ?Caller Name Armel Rabbani ?Caller Phone Number [phone] ?Patient Name Douglas [name] ?Patient DOB 04-20-1946 ?Call Type Message Only Information Provided ?Reason for Call Request to Reschedule Office Appointment ?Initial Comment caller states would like to reschedule his appt for tomorrow at 945. ?Patient request to speak to RN No ?Disp. Time Disposition Final User ?03/17/2022 4:58:41 PM General Information Provided Yes Darci Needle ?Call Closed By: Darci Needle ?Transaction Date/Time: 03/17/2022 4:56:01 PM (ET ?

## 2023-03-15 ENCOUNTER — Telehealth: Payer: Self-pay

## 2023-03-15 NOTE — Telephone Encounter (Signed)
Tried calling patient on both numbers no vm on one and other call couldn't go through.

## 2023-03-15 NOTE — Telephone Encounter (Signed)
Thank you for trying.

## 2023-03-15 NOTE — Telephone Encounter (Signed)
Pt is past due for yearly appointment with Dr Alphonsus Sias. Please schedule. Thanks.

## 2023-07-07 ENCOUNTER — Telehealth: Payer: Self-pay | Admitting: Internal Medicine

## 2023-07-07 NOTE — Telephone Encounter (Signed)
Tried to contact patient to schedule annual physical- unable to leave message

## 2023-11-29 ENCOUNTER — Encounter: Payer: Self-pay | Admitting: Internal Medicine

## 2023-11-29 ENCOUNTER — Ambulatory Visit (INDEPENDENT_AMBULATORY_CARE_PROVIDER_SITE_OTHER): Payer: Medicare Other | Admitting: Internal Medicine

## 2023-11-29 VITALS — BP 128/70 | HR 80 | Ht 65.75 in | Wt 163.0 lb

## 2023-11-29 DIAGNOSIS — Z1211 Encounter for screening for malignant neoplasm of colon: Secondary | ICD-10-CM | POA: Diagnosis not present

## 2023-11-29 DIAGNOSIS — E1142 Type 2 diabetes mellitus with diabetic polyneuropathy: Secondary | ICD-10-CM

## 2023-11-29 DIAGNOSIS — Z23 Encounter for immunization: Secondary | ICD-10-CM

## 2023-11-29 DIAGNOSIS — Z0000 Encounter for general adult medical examination without abnormal findings: Secondary | ICD-10-CM

## 2023-11-29 DIAGNOSIS — E785 Hyperlipidemia, unspecified: Secondary | ICD-10-CM

## 2023-11-29 DIAGNOSIS — B372 Candidiasis of skin and nail: Secondary | ICD-10-CM

## 2023-11-29 LAB — CBC
HCT: 48.1 % (ref 39.0–52.0)
Hemoglobin: 15.8 g/dL (ref 13.0–17.0)
MCHC: 32.8 g/dL (ref 30.0–36.0)
MCV: 88.5 fL (ref 78.0–100.0)
Platelets: 196 10*3/uL (ref 150.0–400.0)
RBC: 5.43 Mil/uL (ref 4.22–5.81)
RDW: 14 % (ref 11.5–15.5)
WBC: 5.3 10*3/uL (ref 4.0–10.5)

## 2023-11-29 LAB — HM DIABETES FOOT EXAM

## 2023-11-29 LAB — COMPREHENSIVE METABOLIC PANEL
ALT: 25 U/L (ref 0–53)
AST: 17 U/L (ref 0–37)
Albumin: 4.3 g/dL (ref 3.5–5.2)
Alkaline Phosphatase: 81 U/L (ref 39–117)
BUN: 12 mg/dL (ref 6–23)
CO2: 28 meq/L (ref 19–32)
Calcium: 9.7 mg/dL (ref 8.4–10.5)
Chloride: 98 meq/L (ref 96–112)
Creatinine, Ser: 1.11 mg/dL (ref 0.40–1.50)
GFR: 64.66 mL/min (ref >60.00)
Glucose, Bld: 325 mg/dL — ABNORMAL HIGH (ref 70–99)
Potassium: 4 meq/L (ref 3.5–5.1)
Sodium: 135 meq/L (ref 135–145)
Total Bilirubin: 0.4 mg/dL (ref 0.2–1.2)
Total Protein: 7.5 g/dL (ref 6.0–8.3)

## 2023-11-29 LAB — LIPID PANEL
Cholesterol: 274 mg/dL — ABNORMAL HIGH (ref 0–200)
HDL: 54.9 mg/dL (ref >39.00)
LDL Cholesterol: 194 mg/dL — ABNORMAL HIGH (ref 0–99)
NonHDL: 219.49
Total CHOL/HDL Ratio: 5
Triglycerides: 126 mg/dL (ref 0.0–149.0)
VLDL: 25.2 mg/dL (ref 0.0–40.0)

## 2023-11-29 LAB — MICROALBUMIN / CREATININE URINE RATIO
Creatinine,U: 111.3 mg/dL
Microalb Creat Ratio: 15.7 mg/g (ref 0.0–30.0)
Microalb, Ur: 17.5 mg/dL — ABNORMAL HIGH (ref 0.0–1.9)

## 2023-11-29 MED ORDER — PIOGLITAZONE HCL 30 MG PO TABS: 30.0000 mg | ORAL_TABLET | Freq: Every day | ORAL | 3 refills | Status: AC

## 2023-11-29 MED ORDER — FLUCONAZOLE 150 MG PO TABS
150.0000 mg | ORAL_TABLET | ORAL | 5 refills | Status: DC
Start: 2023-11-29 — End: 2024-03-01

## 2023-11-29 MED ORDER — KETOCONAZOLE 2 % EX CREA: TOPICAL_CREAM | Freq: Every day | CUTANEOUS | 1 refills | Status: AC

## 2023-11-29 MED ORDER — ATORVASTATIN CALCIUM 40 MG PO TABS: 40.0000 mg | ORAL_TABLET | Freq: Every day | ORAL | 3 refills | Status: AC

## 2023-11-29 MED ORDER — GLIPIZIDE 10 MG PO TABS: 10.0000 mg | ORAL_TABLET | Freq: Two times a day (BID) | ORAL | 3 refills | Status: DC

## 2023-11-29 MED ORDER — METFORMIN HCL ER 500 MG PO TB24: 1000.0000 mg | ORAL_TABLET | Freq: Two times a day (BID) | ORAL | 3 refills | Status: DC

## 2023-11-29 NOTE — Progress Notes (Signed)
 Subjective:    Patient ID: Douglas [name], male    DOB: 1946-07-20, 78 y.o.   MRN: [id number]  HPI Here for Medicare wellness visit and follow up of chronic health conditions Reviewed advanced directives Reviewed other doctors--- No hospitalizations or surgery in the past year Vision is okay---way overdue for exam Hearing is okay No set exercise---but active at work No alcohol or tobacco No falls No depression or anhedonia Independent with instrumental ADLs No memory problems  Has let everything go Hasn't taken any medications Not checking sugars Realized he needs to get back on the program  Didn't like the jardiance ---gave him genital yeast infections Still having it though----on fire and rash No meds since October Some foot tingling---no sig pain though  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 (Patient not taking: Reported on 11/29/2023) 12 each 5   glipiZIDE  (GLUCOTROL ) 10 MG tablet Take 1 tablet (10 mg total) by mouth 2 (two) times daily before a meal. (Patient not taking: Reported on 11/29/2023) 180 tablet 3   glucose blood test strip Use as instructed to test blood sugar once daily dx: E11.65 (Patient not taking: Reported on 11/29/2023) 100 each 3   ketoconazole  (NIZORAL ) 2 % cream Apply topically daily. (Patient not taking: Reported on 11/29/2023) 30 g 1   metFORMIN  (GLUCOPHAGE -XR) 500 MG 24 hr tablet Take 2  tablets (1,000 mg total) by mouth 2 (two) times daily with a meal. (Patient not taking: Reported on 11/29/2023) 360 tablet 3   pioglitazone  (ACTOS ) 30 MG tablet Take 1 tablet by mouth once daily (Patient not taking: Reported on 11/29/2023) 30 tablet 5   No current facility-administered medications on file prior to visit.    No Known Allergies  Past Medical History:  Diagnosis Date   Allergic rhinitis due to pollen    BPH (benign prostatic hypertrophy)    ED (erectile dysfunction)    GERD (gastroesophageal reflux disease)    Hyperlipidemia    Type II or unspecified type diabetes mellitus without mention of complication, not stated as uncontrolled     Past Surgical History:  Procedure Laterality Date   KIDNEY STONE SURGERY  ~1990   WRIST SURGERY  1970's    Family History  Problem Relation Age of Onset   Cancer Mother    Diabetes Daughter     Social History   Socioeconomic History   Marital status: Married    Spouse name: Not on file   Number of children: 3   Years of education: Not on file   Highest education level: Not on file  Occupational History   Occupation: Zone Event Organiser: [employer]    Comment: Retired   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   Wife to be health care POA---alternate is daughter or son   Would want resuscitation attempts   Wouldn't want tube feeds if cognitively unaware   Social Drivers of Health   Financial Resource Strain: Not on file  Food Insecurity: Not on file  Transportation Needs: Not on file  Physical Activity: Not on file  Stress: Not on file  Social Connections:  Not on file  Intimate Partner Violence: Not on file   Review of Systems Appetite is good--small frequent meals Still has sweets, etc in house---wife gets them Weight is down slightly Needs dental work--but overdue Does wear seat belt Sleep affected by some left leg pain at night --?from knee aching (but doesn't seem to be the knee). Hasn't taken any meds like tylenol  Some cough--with phlegm No SOB No chest pain No syncope---some lightheaded feeling/vertigo (like his balance is off) No palpitations Bowels move okay--no blood No other skin problems No heartburn or dysphagia    Objective:   Physical Exam Constitutional:      Appearance: Normal appearance.  HENT:     Mouth/Throat:     Pharynx: No oropharyngeal exudate or posterior oropharyngeal erythema.  Eyes:     Conjunctiva/sclera: Conjunctivae normal.     Pupils: Pupils are equal, round, and reactive to light.  Cardiovascular:     Rate and Rhythm: Normal rate and regular rhythm.     Pulses: Normal pulses.     Heart sounds: No murmur heard.    No gallop.  Pulmonary:     Effort: Pulmonary effort is normal.     Breath sounds: Normal breath sounds. No wheezing or rales.  Abdominal:     Palpations: Abdomen is soft.     Tenderness: There is no abdominal tenderness.  Genitourinary:    Comments: Mild balanitis Musculoskeletal:     Cervical back: Neck supple.     Right lower leg: No edema.     Left lower leg: No edema.  Lymphadenopathy:     Cervical: No cervical adenopathy.  Skin:    Findings: No lesion or rash.     Comments: No foot lesions  Neurological:     General: No focal deficit present.     Mental Status: He is alert and oriented to person, place, and time.     Comments: Mini--cog--normal Decreased sensation in feet  Psychiatric:        Mood and Affect: Mood normal.        Behavior: Behavior normal.            Assessment & Plan:

## 2023-11-29 NOTE — Addendum Note (Signed)
 Addended by: Eual Fines on: 11/29/2023 12:29 PM   Modules accepted: Orders

## 2023-11-29 NOTE — Assessment & Plan Note (Signed)
 Will restart the atorvastatin 40

## 2023-11-29 NOTE — Assessment & Plan Note (Signed)
 Off all meds for many months and control is worse--12.8% Will restart metformin 1000mg  daily, pioglitazone 30 and glipizide 10 bid Mild neuropathy

## 2023-11-29 NOTE — Assessment & Plan Note (Signed)
 Mild balanitis Fluconazole weekly and topical ketoconazole

## 2023-11-29 NOTE — Progress Notes (Signed)
Vision Screening   Right eye Left eye Both eyes  Without correction     With correction 20/20 20/25 20/25  Hearing Screening - Comments:: Passed whisper test  

## 2023-11-29 NOTE — Patient Instructions (Signed)
 Please go to the pharmacy for your COVID vaccine and the first shingrix. Then go back in 2-3 months for the second shigrix and your tetanus booster

## 2023-11-29 NOTE — Assessment & Plan Note (Signed)
 I have personally reviewed the Medicare Annual Wellness questionnaire and have noted 1. The patient's medical and social history 2. Their use of alcohol, tobacco or illicit drugs 3. Their current medications and supplements 4. The patient's functional ability including ADL's, fall risks, home safety risks and hearing or visual             impairment. 5. Diet and physical activities 6. Evidence for depression or mood disorders  The patients weight, height, BMI and visual acuity have been recorded in the chart I have made referrals, counseling and provided education to the patient based review of the above and I have provided the pt with a written personalized care plan for preventive services.  I have provided you with a copy of your personalized plan for preventive services. Please take the time to review along with your updated medication list.  Stays active Will do FIT No PSA due to age Flu/prevnar 20 today. COVID/shingrix/Td at pharmacy

## 2023-12-17 ENCOUNTER — Ambulatory Visit: Payer: Self-pay | Admitting: Internal Medicine

## 2023-12-17 ENCOUNTER — Ambulatory Visit
Admission: EM | Admit: 2023-12-17 | Discharge: 2023-12-17 | Disposition: A | Discharge status: Home / Self Care | Payer: Medicare Other | Attending: Internal Medicine | Admitting: Internal Medicine

## 2023-12-17 ENCOUNTER — Telehealth: Payer: Self-pay | Admitting: Internal Medicine

## 2023-12-17 DIAGNOSIS — S61213A Laceration without foreign body of left middle finger without damage to nail, initial encounter: Secondary | ICD-10-CM | POA: Diagnosis not present

## 2023-12-17 MED ORDER — TETANUS-DIPHTH-ACELL PERTUSSIS 5-2.5-18.5 LF-MCG/0.5 IM SUSY
0.5000 mL | PREFILLED_SYRINGE | Freq: Once | INTRAMUSCULAR | Status: AC
  Administered 2023-12-17: 0.5 mL via INTRAMUSCULAR

## 2023-12-17 NOTE — ED Provider Notes (Signed)
[facility]    CSN: [id number] Arrival date & time: 12/17/23  1454      History   Chief Complaint Chief Complaint  Patient presents with   Laceration    HPI Douglas [name] is a 78 y.o. male who presents wth cut on the tip of his L middle finger and nail with a utility knife. He states he was cutting with his R hand and something caused the knife to get stuck and when he pushed it to move he accidentally cut the tip of his finger and nail yesterday. He does not  recall when his last TD was, but knows he is due per his PCP when he had a physical 2 weeks ago.     Past Medical History:  Diagnosis Date   Allergic rhinitis due to pollen    BPH (benign prostatic hypertrophy)    ED (erectile dysfunction)    GERD (gastroesophageal reflux disease)    Hyperlipidemia    Type II or unspecified type diabetes mellitus without mention of complication, not stated as uncontrolled     Patient Active Problem List   Diagnosis Date Noted   Skin yeast infection 12/09/2021   Advance directive discussed with patient 07/24/2020   Type 2 diabetes mellitus with polyneuropathy (HCC) 07/15/2015   BPH with obstruction/lower urinary tract symptoms    Routine general medical examination at a health care facility 06/08/2014   GERD (gastroesophageal reflux disease)    Allergic rhinitis due to pollen    Hyperlipidemia    BPPV (benign paroxysmal positional vertigo) 02/14/2009    Past Surgical History:  Procedure Laterality Date   KIDNEY STONE SURGERY  ~1990   WRIST SURGERY  1970's       Home Medications    Prior to Admission medications   Medication Sig Start Date End Date Taking? Authorizing Provider  atorvastatin (LIPITOR) 40 MG tablet Take 1 tablet (40 mg total) by mouth daily. 11/29/23  Yes Karie Schwalbe, MD  glipiZIDE (GLUCOTROL) 10 MG tablet Take 1 tablet (10 mg total) by mouth 2 (two) times daily before a meal. 11/29/23  Yes Karie Schwalbe, MD  metFORMIN (GLUCOPHAGE-XR)  500 MG 24 hr tablet Take 2 tablets (1,000 mg total) by mouth 2 (two) times daily with a meal. 11/29/23  Yes Karie Schwalbe, MD  pioglitazone (ACTOS) 30 MG tablet Take 1 tablet (30 mg total) by mouth daily. 11/29/23  Yes Karie Schwalbe, MD  BAYER MICROLET LANCETS lancets Use as instructed to test blood sugar once daily dx:E11.65 Patient not taking: Reported on 11/29/2023 12/11/14   Karie Schwalbe, MD  fluconazole (DIFLUCAN) 150 MG tablet Take 1 tablet (150 mg total) by mouth once a week. 11/29/23   Tillman Abide I, MD  glucose blood test strip Use as instructed to test blood sugar once daily dx: E11.65 Patient not taking: Reported on 11/29/2023 12/11/14   Karie Schwalbe, MD  ketoconazole (NIZORAL) 2 % cream Apply topically daily. 11/29/23   Karie Schwalbe, MD    Family History Family History  Problem Relation Age of Onset   Cancer Mother    Diabetes Daughter     Social History Social History   Tobacco Use   Smoking status: Never    Passive exposure: Never   Smokeless tobacco: Never  Vaping Use   Vaping status: Never Used  Substance Use Topics   Alcohol use: No   Drug use: No     Allergies   Patient has  no known allergies.   Review of Systems Review of Systems As noted in HPI  Physical Exam Triage Vital Signs ED Triage Vitals  Encounter Vitals Group     BP 12/17/23 1659 134/79     Systolic BP Percentile --      Diastolic BP Percentile --      Pulse Rate 12/17/23 1659 80     Resp 12/17/23 1659 18     Temp 12/17/23 1659 97.9 F (36.6 C)     Temp Source 12/17/23 1659 Oral     SpO2 12/17/23 1659 97 %     Weight 12/17/23 1657 160 lb (72.6 kg)     Height 12/17/23 1657 5' 7.5" (1.715 m)     Head Circumference --      Peak Flow --      Pain Score 12/17/23 1654 0     Pain Loc --      Pain Education --      Exclude from Growth Chart --    No data found.  Updated Vital Signs BP 134/79 (BP Location: Right Arm)   Pulse 80   Temp 97.9 F (36.6 C) (Oral)    Resp 18   Ht 5' 7.5" (1.715 m)   Wt 160 lb (72.6 kg)   SpO2 97%   BMI 24.69 kg/m   Visual Acuity Right Eye Distance:   Left Eye Distance:   Bilateral Distance:    Right Eye Near:   Left Eye Near:    Bilateral Near:     Physical Exam Vitals and nursing note reviewed.  Constitutional:      General: He is not in acute distress.    Appearance: He is normal weight. He is not toxic-appearing.  HENT:     Right Ear: External ear normal.     Left Ear: External ear normal.  Eyes:     General: No scleral icterus.    Conjunctiva/sclera: Conjunctivae normal.  Pulmonary:     Effort: Pulmonary effort is normal.  Musculoskeletal:        General: Normal range of motion.     Cervical back: Neck supple.  Skin:    General: Skin is warm and dry.     Comments: L MIDDLE FINGER- with laceration on the distal nail across, but is not bleeding, and has avulsion laceration of about 3 mm x 1.5 cm on the tip of his finger as well which is clean and not bleeding. ROM is normal. Sensation is normal  Neurological:     Mental Status: He is alert and oriented to person, place, and time.     Gait: Gait normal.  Psychiatric:        Mood and Affect: Mood normal.        Behavior: Behavior normal.        Thought Content: Thought content normal.        Judgment: Judgment normal.      UC Treatments / Results  Labs (all labs ordered are listed, but only abnormal results are displayed) Labs Reviewed - No data to display  EKG   Radiology No results found.  Procedures Procedures (including critical care time)  Medications Ordered in UC Medications  Tdap (BOOSTRIX) injection 0.5 mL (0.5 mLs Intramuscular Given 12/17/23 1748)    Initial Impression / Assessment and Plan / UC Course  I have reviewed the triage vital signs and the nursing notes.  TDAP was given. I applied Glue over the nail cut to prevent any infection.  L middle finger laceration  Wound was cleansed with Hibiclens and triple  antibiotic ointment applied to the wound with gauze. Instructions of how to care for the wound explained.    Final Clinical Impressions(s) / UC Diagnoses   Final diagnoses:  Laceration of middle finger of left hand without complication, initial encounter     Discharge Instructions      Keep the laceration clean and apply triple antibiotic ointment twice a day for 7 days. Leave it open to air when resting. It is OK to get it wet once the wound on the finger has healed. The glue will peal off on its own Watch for signs of infection like increased pain, redness or swelling.      ED Prescriptions   None    PDMP not reviewed this encounter.   Garey Ham, PA-C 12/17/23 1751

## 2023-12-17 NOTE — Telephone Encounter (Signed)
Chief Complaint: lac to tip of middle left finger Symptoms: finger lac, pain Frequency: last night with utility knife Pertinent Negatives: Patient denies bleeding, numbness or loss of sensation Disposition: [] ED /[] Urgent Care (no appt availability in office) / [x] Appointment(In office/virtual)/ []  [facility]/ [] Home Care/ [] Refused Recommended Disposition /[] [facility]/ []  Follow-up with PCP Additional Notes: Pt reports cutting the tip of his left middle finger last night with a utility knife. Pt states the finger and nail are still intact. States it bled for 2 seconds and stopped. Stated he sprayed antibiotic on it and wrapped it. Hx of diabetes. Unknown last tetanus. States 4/10 pain if he touches it or bumps it into something. Denies fever or chills. States he can still use his hand and extend his finger. States he is currently at work at [employer] and wants to be seen close to work. This RN found openings at [facility] for today. Pt stated he would need to call his wife and call us back before confirming an appt. Pt stated he will call back shortly, call back number given by this RN.   Reason for Disposition  [1] No prior tetanus shots (or is not fully vaccinated) AND [2] any wound (e.g., cut, scrape)  Answer Assessment - Initial Assessment Questions 1. MECHANISM: "How did the injury happen?"      "Cutting something with a utility knife and cut the tip of my finger, didn't cut through the whole tip of finger, but sliced through." "Fingernail still on there. Tip is still there but you can see where I sliced there." Left hand middle finger. Sprayed antiobiotic "stuff on it" 2. ONSET: "When did the injury happen?" (Minutes or hours ago)      Yesterday 3. LOCATION: "What part of the finger is injured?" "Is the nail damaged?"      Tip 4. APPEARANCE of the INJURY: "What does the injury look like?"      Currently bandaged up. Bled for a second or two before it  stopoed - didn't lose a lot of blood.  5. 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 SIZE: For cuts, bruises, or swelling, ask: "How large is it?" (e.g., inches or centimeters;  entire finger)      "Kinda like if you took your mouth and bit the tip of your nail", near the tip 7. PAIN: "Is there pain?" If Yes, ask: "How bad is the pain?"    (e.g., Scale 1-10; or mild, moderate, severe)  - NONE (0): no pain.  - MILD (1-3): doesn't interfere with normal activities.   - MODERATE (4-7): interferes with normal activities or awakens from sleep.  - SEVERE (8-10): excruciating pain, unable to hold a glass of water or bend finger even a little.     3-4/10, throbbing 8. TETANUS: For any breaks in the skin, ask: "When was the last tetanus booster?"     "Just left [place] a couple weeks and got a flu shot and a few other shots, they would have said something about tetanus" 9. OTHER SYMPTOMS: "Do you have any other symptoms?"     Hx of diabetes. No fever, no chills.  Protocols used: Finger Injury-A-AH

## 2023-12-17 NOTE — Telephone Encounter (Signed)
Okay--I will check him MOnday if still needed

## 2023-12-17 NOTE — Telephone Encounter (Unsigned)
Copied from CRM [id number]. Topic: Clinical - Pink Word Triage >> Dec 17, 2023  9:56 AM Fredrich Romans wrote: Reason for Triage: cut tip of finger

## 2023-12-17 NOTE — Telephone Encounter (Signed)
Pt was placed on Dr Karle Starch schedule 12-20-23. I called pt back to advise that was not appropriate for the severity of the injury. He is going to try and go to an UC to be seen. Asked that we keep the appt with Dr Alphonsus Sias for 12-20-23 just in case.

## 2023-12-17 NOTE — ED Triage Notes (Signed)
"  I was putting together stuff to store in a bag and when cutting stuff to fit accidentally sliced my finger with my own utility knife". DOI: 12-16-2023. Time: "mid day". Site: Left hand 3rd finger (middle).  Last Td (10-18-2009).

## 2023-12-17 NOTE — Discharge Instructions (Addendum)
Keep the laceration clean and apply triple antibiotic ointment twice a day for 7 days. Leave it open to air when resting. It is OK to get it wet once the wound on the finger has healed. The glue will peal off on its own Watch for signs of infection like increased pain, redness or swelling.

## 2023-12-20 ENCOUNTER — Ambulatory Visit: Payer: Medicare Other | Admitting: Internal Medicine

## 2024-02-02 ENCOUNTER — Telehealth: Payer: Self-pay | Admitting: Internal Medicine

## 2024-02-02 MED ORDER — VALSARTAN 40 MG PO TABS: 40.0000 mg | ORAL_TABLET | Freq: Every day | ORAL | 3 refills | Status: AC

## 2024-02-02 NOTE — Telephone Encounter (Signed)
 Phone call to patient to discuss that lab error with the urine microal measurements. Recommended he start low dose ARB and he is willing to do that Is taking his other meds Will start valsartan 40mg  daily Coming in next month---will check renal profile then

## 2024-03-01 ENCOUNTER — Ambulatory Visit (INDEPENDENT_AMBULATORY_CARE_PROVIDER_SITE_OTHER): Payer: Medicare Other | Admitting: Internal Medicine

## 2024-03-01 ENCOUNTER — Encounter: Payer: Self-pay | Admitting: Internal Medicine

## 2024-03-01 VITALS — BP 122/78 | HR 75 | Temp 97.7°F | Ht 65.75 in | Wt 169.0 lb

## 2024-03-01 DIAGNOSIS — E1142 Type 2 diabetes mellitus with diabetic polyneuropathy: Secondary | ICD-10-CM

## 2024-03-01 DIAGNOSIS — E119 Type 2 diabetes mellitus without complications: Secondary | ICD-10-CM | POA: Diagnosis not present

## 2024-03-01 DIAGNOSIS — E1121 Type 2 diabetes mellitus with diabetic nephropathy: Secondary | ICD-10-CM | POA: Diagnosis not present

## 2024-03-01 DIAGNOSIS — Z7984 Long term (current) use of oral hypoglycemic drugs: Secondary | ICD-10-CM

## 2024-03-01 LAB — POCT GLYCOSYLATED HEMOGLOBIN (HGB A1C): Hemoglobin A1C: 10.5 % — AB (ref 4.0–5.6)

## 2024-03-01 MED ORDER — FLUCONAZOLE 150 MG PO TABS: 150.0000 mg | ORAL_TABLET | ORAL | 1 refills | Status: AC

## 2024-03-01 MED ORDER — METFORMIN HCL ER 500 MG PO TB24: 1000.0000 mg | ORAL_TABLET | Freq: Every day | ORAL | 3 refills | Status: AC

## 2024-03-01 MED ORDER — EMPAGLIFLOZIN 10 MG PO TABS: 10.0000 mg | ORAL_TABLET | Freq: Every day | ORAL | 5 refills | Status: AC

## 2024-03-01 NOTE — Assessment & Plan Note (Signed)
 Albuminuria with normal GFR Was reluctant to take losartan--but will restart the jardiance

## 2024-03-01 NOTE — Progress Notes (Signed)
 Subjective:    Patient ID: Douglas [name], male    DOB: 1946/07/21, 78 y.o.   MRN: [id number]  HPI Here for follow up of poorly controlled diabetes  Back on his meds Can't take the metformin in the morning---has to run to bathroom and can't do that at work Discussed jardiance---he got genital yeast infection with that  No chest pain or SOB  Current Outpatient Medications on File Prior to Visit  Medication Sig Dispense Refill   atorvastatin (LIPITOR) 40 MG tablet Take 1 tablet (40 mg total) by mouth daily. 90 tablet 3   BAYER MICROLET LANCETS lancets Use as instructed to test blood sugar once daily dx:E11.65 100 each 3   fluconazole (DIFLUCAN) 150 MG tablet Take 1 tablet (150 mg total) by mouth once a week. 12 tablet 5   glipiZIDE (GLUCOTROL) 10 MG tablet Take 1 tablet (10 mg total) by mouth 2 (two) times daily before a meal. 180 tablet 3   glucose blood test strip Use as instructed to test blood sugar once daily dx: E11.65 100 each 3   ketoconazole (NIZORAL) 2 % cream Apply topically daily. 30 g 1   metFORMIN (GLUCOPHAGE-XR) 500 MG 24 hr tablet Take 2 tablets (1,000 mg total) by mouth 2 (two) times daily with a meal. 180 tablet 3   pioglitazone (ACTOS) 30 MG tablet Take 1 tablet (30 mg total) by mouth daily. 90 tablet 3   valsartan (DIOVAN) 40 MG tablet Take 1 tablet (40 mg total) by mouth daily. (Patient not taking: Reported on 03/01/2024) 90 tablet 3   No current facility-administered medications on file prior to visit.    No Known Allergies  Past Medical History:  Diagnosis Date   Allergic rhinitis due to pollen    BPH (benign prostatic hypertrophy)    ED (erectile dysfunction)    GERD (gastroesophageal reflux disease)    Hyperlipidemia    Type II or unspecified type diabetes mellitus without mention of complication, not stated as uncontrolled     Past Surgical History:  Procedure Laterality Date   KIDNEY STONE SURGERY  ~1990   WRIST SURGERY  1970's    Family History   Problem Relation Age of Onset   Cancer Mother    Diabetes Daughter     Social History   Socioeconomic History   Marital status: Married    Spouse name: Not on file   Number of children: 3   Years of education: Not on file   Highest education level: Not on file  Occupational History   Occupation: Zone Event organiser: [employer]    Comment: Retired   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   Wife to be health care POA---alternate is daughter or son   Would want resuscitation attempts   Wouldn't want tube feeds if cognitively unaware   Social Drivers of Health   Financial Resource Strain: Not on file  Food Insecurity: Not on file  Transportation Needs: Not on file  Physical Activity: Not on file  Stress: Not on file  Social Connections: Not on file  Intimate Partner Violence: Not on file   Review of Systems Sleeps okay Some cough with pollen---discussed OTC antihistamines    Objective:   Physical Exam Cardiovascular:     Rate and Rhythm: Normal rate and regular rhythm.     Pulses: Normal pulses.     Heart sounds: No murmur heard.    No gallop.  Pulmonary:     Effort: Pulmonary effort is normal.     Breath sounds: Normal breath sounds. No wheezing or rales.  Musculoskeletal:     Cervical back: Neck supple.     Right lower leg: No edema.     Left lower leg: No edema.  Lymphadenopathy:     Cervical: No cervical adenopathy.  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

## 2024-03-01 NOTE — Assessment & Plan Note (Signed)
 Lab Results  Component Value Date   HGBA1C 10.5 (A) 03/01/2024   Much better but still not close to acceptable Will change the metformin to just 1000 mg at supper Glipizide 10 bid, actos 30 Will add back the jardiance 10---with weekly fluconazole for yeast

## 2024-03-23 ENCOUNTER — Telehealth: Payer: Self-pay

## 2024-03-23 NOTE — Telephone Encounter (Signed)
 Patient was identified as falling into the [place] Measure - Diabetes.   Patient was: Appointment already scheduled for:  06/06/24.

## 2024-06-06 ENCOUNTER — Ambulatory Visit: Admitting: Internal Medicine

## 2024-07-25 DIAGNOSIS — S63112A Subluxation of metacarpophalangeal joint of left thumb, initial encounter: Secondary | ICD-10-CM | POA: Diagnosis not present

## 2024-08-01 DIAGNOSIS — S63112A Subluxation of metacarpophalangeal joint of left thumb, initial encounter: Secondary | ICD-10-CM | POA: Diagnosis not present

## 2024-08-22 DIAGNOSIS — S63112D Subluxation of metacarpophalangeal joint of left thumb, subsequent encounter: Secondary | ICD-10-CM | POA: Diagnosis not present

## 2024-08-22 DIAGNOSIS — M65341 Trigger finger, right ring finger: Secondary | ICD-10-CM | POA: Diagnosis not present

## 2024-10-16 ENCOUNTER — Telehealth: Payer: Self-pay | Admitting: *Deleted

## 2024-10-16 ENCOUNTER — Telehealth: Payer: Self-pay

## 2024-10-16 NOTE — Telephone Encounter (Signed)
 Copied from CRM #[id number]. Topic: Appointments - Transfer of Care >> Oct 16, 2024  2:43 PM Berneda FALCON wrote: Pt is requesting to transfer FROM: [place] Pt is requesting to transfer TO: [place] Reason for requested transfer: PCP resigned It is the responsibility of the team the patient would like to transfer to (Dr. Bennett) to reach out to the patient if for any reason this transfer is not acceptable.

## 2024-10-18 NOTE — Telephone Encounter (Signed)
 Ok for [employer]

## 2024-12-04 ENCOUNTER — Other Ambulatory Visit: Payer: Self-pay

## 2024-12-04 MED ORDER — GLIPIZIDE 10 MG PO TABS: 10.0000 mg | ORAL_TABLET | Freq: Two times a day (BID) | ORAL | 0 refills | Status: AC

## 2024-12-15 ENCOUNTER — Encounter
